# Patient Record
Sex: Male | Born: 1965 | Race: White | Hispanic: No | Marital: Married | Smoking: Former smoker
Health system: Southern US, Community
[De-identification: ages and names within clinical notes are randomized; demographics above are authoritative.]

## PROBLEM LIST (undated history)

## (undated) DIAGNOSIS — G8929 Other chronic pain: Secondary | ICD-10-CM

## (undated) DIAGNOSIS — K5730 Diverticulosis of large intestine without perforation or abscess without bleeding: Secondary | ICD-10-CM

## (undated) DIAGNOSIS — F419 Anxiety disorder, unspecified: Secondary | ICD-10-CM

## (undated) DIAGNOSIS — G4733 Obstructive sleep apnea (adult) (pediatric): Secondary | ICD-10-CM

## (undated) DIAGNOSIS — Z9989 Dependence on other enabling machines and devices: Secondary | ICD-10-CM

## (undated) DIAGNOSIS — I10 Essential (primary) hypertension: Secondary | ICD-10-CM

## (undated) DIAGNOSIS — F329 Major depressive disorder, single episode, unspecified: Secondary | ICD-10-CM

## (undated) DIAGNOSIS — M5136 Other intervertebral disc degeneration, lumbar region: Secondary | ICD-10-CM

## (undated) DIAGNOSIS — M1990 Unspecified osteoarthritis, unspecified site: Secondary | ICD-10-CM

## (undated) DIAGNOSIS — M545 Low back pain, unspecified: Secondary | ICD-10-CM

## (undated) DIAGNOSIS — Z8719 Personal history of other diseases of the digestive system: Secondary | ICD-10-CM

## (undated) DIAGNOSIS — I4510 Unspecified right bundle-branch block: Secondary | ICD-10-CM

## (undated) DIAGNOSIS — M51369 Other intervertebral disc degeneration, lumbar region without mention of lumbar back pain or lower extremity pain: Secondary | ICD-10-CM

## (undated) DIAGNOSIS — N200 Calculus of kidney: Secondary | ICD-10-CM

## (undated) DIAGNOSIS — Z972 Presence of dental prosthetic device (complete) (partial): Secondary | ICD-10-CM

## (undated) DIAGNOSIS — R3915 Urgency of urination: Secondary | ICD-10-CM

## (undated) DIAGNOSIS — F32A Depression, unspecified: Secondary | ICD-10-CM

## (undated) HISTORY — DX: Anxiety disorder, unspecified: F41.9

## (undated) HISTORY — DX: Major depressive disorder, single episode, unspecified: F32.9

## (undated) HISTORY — DX: Other chronic pain: G89.29

## (undated) HISTORY — DX: Depression, unspecified: F32.A

## (undated) HISTORY — PX: NO PAST SURGERIES: SHX2092

## (undated) HISTORY — PX: COLONOSCOPY W/ BIOPSIES AND POLYPECTOMY: SHX1376

## (undated) HISTORY — PX: KIDNEY STONE SURGERY: SHX686

---

## 2000-06-09 DIAGNOSIS — G8929 Other chronic pain: Secondary | ICD-10-CM

## 2000-06-09 HISTORY — DX: Other chronic pain: G89.29

## 2007-07-24 ENCOUNTER — Other Ambulatory Visit: Payer: Self-pay

## 2007-07-24 ENCOUNTER — Emergency Department: Payer: Self-pay | Admitting: Emergency Medicine

## 2008-09-19 ENCOUNTER — Ambulatory Visit: Payer: Self-pay | Admitting: Family Medicine

## 2010-09-19 ENCOUNTER — Ambulatory Visit: Payer: Self-pay

## 2013-09-08 DIAGNOSIS — F32A Depression, unspecified: Secondary | ICD-10-CM | POA: Insufficient documentation

## 2013-09-08 DIAGNOSIS — F329 Major depressive disorder, single episode, unspecified: Secondary | ICD-10-CM | POA: Insufficient documentation

## 2013-09-08 DIAGNOSIS — M549 Dorsalgia, unspecified: Secondary | ICD-10-CM | POA: Insufficient documentation

## 2013-09-08 DIAGNOSIS — I10 Essential (primary) hypertension: Secondary | ICD-10-CM | POA: Insufficient documentation

## 2013-10-03 DIAGNOSIS — M25562 Pain in left knee: Secondary | ICD-10-CM | POA: Insufficient documentation

## 2014-09-26 DIAGNOSIS — R5383 Other fatigue: Secondary | ICD-10-CM | POA: Insufficient documentation

## 2014-12-20 DIAGNOSIS — G4733 Obstructive sleep apnea (adult) (pediatric): Secondary | ICD-10-CM | POA: Insufficient documentation

## 2015-02-05 ENCOUNTER — Emergency Department: Payer: Self-pay

## 2015-02-05 ENCOUNTER — Emergency Department
Admission: EM | Admit: 2015-02-05 | Discharge: 2015-02-05 | Disposition: A | Discharge status: Home / Self Care | Payer: Self-pay | Attending: Emergency Medicine | Admitting: Emergency Medicine

## 2015-02-05 ENCOUNTER — Telehealth: Payer: Self-pay | Admitting: Emergency Medicine

## 2015-02-05 ENCOUNTER — Encounter: Payer: Self-pay | Admitting: Emergency Medicine

## 2015-02-05 DIAGNOSIS — R1031 Right lower quadrant pain: Secondary | ICD-10-CM

## 2015-02-05 DIAGNOSIS — I10 Essential (primary) hypertension: Secondary | ICD-10-CM | POA: Insufficient documentation

## 2015-02-05 DIAGNOSIS — R079 Chest pain, unspecified: Secondary | ICD-10-CM | POA: Insufficient documentation

## 2015-02-05 DIAGNOSIS — R0602 Shortness of breath: Secondary | ICD-10-CM | POA: Insufficient documentation

## 2015-02-05 DIAGNOSIS — N200 Calculus of kidney: Secondary | ICD-10-CM | POA: Insufficient documentation

## 2015-02-05 DIAGNOSIS — Z720 Tobacco use: Secondary | ICD-10-CM | POA: Insufficient documentation

## 2015-02-05 HISTORY — DX: Other intervertebral disc degeneration, lumbar region: M51.36

## 2015-02-05 HISTORY — DX: Other intervertebral disc degeneration, lumbar region without mention of lumbar back pain or lower extremity pain: M51.369

## 2015-02-05 HISTORY — DX: Essential (primary) hypertension: I10

## 2015-02-05 LAB — COMPREHENSIVE METABOLIC PANEL
ALBUMIN: 4.1 g/dL (ref 3.5–5.0)
ALT: 38 U/L (ref 17–63)
AST: 28 U/L (ref 15–41)
Alkaline Phosphatase: 70 U/L (ref 38–126)
Anion gap: 9 (ref 5–15)
BUN: 26 mg/dL — ABNORMAL HIGH (ref 6–20)
CO2: 23 mmol/L (ref 22–32)
CREATININE: 1.39 mg/dL — AB (ref 0.61–1.24)
Calcium: 9.1 mg/dL (ref 8.9–10.3)
Chloride: 104 mmol/L (ref 101–111)
GFR calc non Af Amer: 58 mL/min — ABNORMAL LOW (ref >60)
GLUCOSE: 134 mg/dL — AB (ref 65–99)
Potassium: 4.1 mmol/L (ref 3.5–5.1)
SODIUM: 136 mmol/L (ref 135–145)
Total Bilirubin: 0.9 mg/dL (ref 0.3–1.2)
Total Protein: 7.5 g/dL (ref 6.5–8.1)

## 2015-02-05 LAB — CBC
HCT: 47.3 % (ref 40.0–52.0)
Hemoglobin: 16 g/dL (ref 13.0–18.0)
MCH: 30.7 pg (ref 26.0–34.0)
MCHC: 33.8 g/dL (ref 32.0–36.0)
MCV: 90.9 fL (ref 80.0–100.0)
PLATELETS: 214 10*3/uL (ref 150–440)
RBC: 5.2 MIL/uL (ref 4.40–5.90)
RDW: 12.5 % (ref 11.5–14.5)
WBC: 18.1 10*3/uL — ABNORMAL HIGH (ref 3.8–10.6)

## 2015-02-05 LAB — URINALYSIS COMPLETE WITH MICROSCOPIC (ARMC ONLY)
BILIRUBIN URINE: NEGATIVE
Bacteria, UA: NONE SEEN
Glucose, UA: 50 mg/dL — AB
LEUKOCYTES UA: NEGATIVE
NITRITE: NEGATIVE
PH: 7 (ref 5.0–8.0)
Protein, ur: NEGATIVE mg/dL
SQUAMOUS EPITHELIAL / LPF: NONE SEEN
Specific Gravity, Urine: 1.041 — ABNORMAL HIGH (ref 1.005–1.030)

## 2015-02-05 LAB — LIPASE, BLOOD: LIPASE: 18 U/L — AB (ref 22–51)

## 2015-02-05 LAB — TROPONIN I: Troponin I: <0.03 ng/mL (ref <0.031)

## 2015-02-05 MED ORDER — HYDROMORPHONE HCL 2 MG PO TABS: 2.0000 mg | ORAL_TABLET | Freq: Two times a day (BID) | ORAL | Status: DC | PRN

## 2015-02-05 MED ORDER — IOHEXOL 300 MG/ML  SOLN
100.0000 mL | Freq: Once | INTRAMUSCULAR | Status: AC | PRN
  Administered 2015-02-05: 100 mL via INTRAVENOUS

## 2015-02-05 MED ORDER — TAMSULOSIN HCL 0.4 MG PO CAPS: 0.4000 mg | ORAL_CAPSULE | Freq: Every day | ORAL | Status: DC

## 2015-02-05 MED ORDER — MORPHINE SULFATE (PF) 4 MG/ML IV SOLN
4.0000 mg | Freq: Once | INTRAVENOUS | Status: AC
  Administered 2015-02-05: 4 mg via INTRAVENOUS
  Filled 2015-02-05: qty 1

## 2015-02-05 MED ORDER — ONDANSETRON HCL 4 MG/2ML IJ SOLN
4.0000 mg | Freq: Once | INTRAMUSCULAR | Status: AC
  Administered 2015-02-05: 4 mg via INTRAVENOUS
  Filled 2015-02-05: qty 2

## 2015-02-05 MED ORDER — SODIUM CHLORIDE 0.9 % IV BOLUS (SEPSIS)
1000.0000 mL | Freq: Once | INTRAVENOUS | Status: AC
  Administered 2015-02-05: 1000 mL via INTRAVENOUS

## 2015-02-05 MED ORDER — ONDANSETRON HCL 4 MG PO TABS: 4.0000 mg | ORAL_TABLET | Freq: Every day | ORAL | Status: DC | PRN

## 2015-02-05 MED ORDER — IOHEXOL 240 MG/ML SOLN
25.0000 mL | Freq: Once | INTRAMUSCULAR | Status: AC | PRN
  Administered 2015-02-05: 25 mL via ORAL

## 2015-02-05 NOTE — ED Provider Notes (Signed)
[facility] Emergency Department Provider Note  ____________________________________________  Time seen: Approximately 350 AM  I have reviewed the triage vital signs and the nursing notes.   HISTORY  Chief Complaint Abdominal Pain and Chest Pain    HPI ARTH NICASTRO is a 49 y.o. male who comes into the hospital with abdominal pain. The patient reports that last night her having some severe right lower quadrant abdominal pain. He reports that he initially thought it was possibly food poisoning but after the pain did not improve after a couple of hours he decided to come into the hospital for evaluation. The patient reports that he's also developed some chest pain and sweats in the middle of his lower chest. The patient reports that this pain is a 9 out of 10 in intensity. He does not take anything for the pain has not had any fevers. He tried to make himself vomit but was unable to do so. The patient had some chills and some dull chest pain that he rates a 5 out of 10. The patient has some shortness of breath and also some back pain. The patient is unsure what causing his symptoms and he decided to come into the hospital for further evaluation.   Past Medical History  Diagnosis Date  . Degenerative disc disease, lumbar   . Hypertension     There are no active problems to display for this patient.   History reviewed. No pertinent past surgical history.  No current outpatient prescriptions on file.  Allergies Review of patient's allergies indicates no known allergies.  History reviewed. No pertinent family history.  Social History Social History  Substance Use Topics  . Smoking status: Current Every Day Smoker -- 1.00 packs/day  . Smokeless tobacco: None  . Alcohol Use: No    Review of Systems Constitutional: No fever/chills Eyes: No visual changes. ENT: No sore throat. Cardiovascular: chest pain. Respiratory:  shortness of  breath. Gastrointestinal: abdominal pain.  No nausea, no vomiting.  No diarrhea.  No constipation. Genitourinary: Negative for dysuria. Musculoskeletal:  back pain. Skin: Negative for rash. Neurological: Negative for headaches, focal weakness or numbness.  10-point ROS otherwise negative.  ____________________________________________   PHYSICAL EXAM:  VITAL SIGNS: ED Triage Vitals  Enc Vitals Group     BP 02/05/15 0400 162/103 mmHg     Pulse Rate 02/05/15 0400 45     Resp 02/05/15 0402 21     Temp 02/05/15 0402 98.2 F (36.8 C)     Temp Source 02/05/15 0402 Oral     SpO2 02/05/15 0400 97 %     Weight 02/05/15 0402 220 lb (99.791 kg)     Height 02/05/15 0402 5\' 8"  (1.727 m)     Head Cir --      Peak Flow --      Pain Score 02/05/15 0403 10     Pain Loc --      Pain Edu? --      Excl. in GC? --     Constitutional: Alert and oriented. ill appearing and in moderate distress. Eyes: Conjunctivae are normal. PERRL. EOMI. Head: Atraumatic. Nose: No congestion/rhinnorhea. Mouth/Throat: Mucous membranes are moist.  Oropharynx non-erythematous. Cardiovascular: Normal rate, regular rhythm. Grossly normal heart sounds.  Good peripheral circulation. Respiratory: Normal respiratory effort.  No retractions. Lungs CTAB. Gastrointestinal: Soft with RLQ pain to palpation. No distention. Positive bowel sounds Musculoskeletal: No lower extremity tenderness nor edema.   Neurologic:  Normal speech and language.  Skin:  Skin is warm, dry and intact.  Psychiatric: Mood and affect are normal.   ____________________________________________   LABS (all labs ordered are listed, but only abnormal results are displayed)  Labs Reviewed  CBC - Abnormal; Notable for the following:    WBC 18.1 (*)    All other components within normal limits  COMPREHENSIVE METABOLIC PANEL - Abnormal; Notable for the following:    Glucose, Bld 134 (*)    BUN 26 (*)    Creatinine, Ser 1.39 (*)    GFR calc non  Af Amer 58 (*)    All other components within normal limits  LIPASE, BLOOD - Abnormal; Notable for the following:    Lipase 18 (*)    All other components within normal limits  TROPONIN I  URINALYSIS COMPLETEWITH MICROSCOPIC ([facility] ONLY)   ____________________________________________  EKG  ED ECG REPORT I, Rebecka Apley, the attending physician, personally viewed and interpreted this ECG.   Date: 02/05/2015  EKG Time: 400  Rate: 74  Rhythm: normal sinus rhythm, with sinus arrhythmia  Axis: normal  Intervals:none  ST&T Change: none  ____________________________________________  RADIOLOGY  CXR: No active cardiopulmonary disease  CTabd and pelvis: Obstructing 5 x 5 x 7 mm right ureteral calculus 2-3 cm above the ureterovesical junction. Marked hydroureter and hydronephrosis. ____________________________________________   PROCEDURES  Procedure(s) performed: None  Critical Care performed: No  ____________________________________________   INITIAL IMPRESSION / ASSESSMENT AND PLAN / ED COURSE  Pertinent labs & imaging results that were available during my care of the patient were reviewed by me and considered in my medical decision making (see chart for details).  The patient is a 49 year old male who comes in tonight with some abdominal pain and chest pain. The patient has some distinct right lower quadrant tenderness to palpation on exam. The patient also has a white blood cell count of 18. I'll give the patient some normal saline as well as morphine and Zofran. We will perform a CT scan to evaluate for possible appendicitis. The patient will be reassessed once he received his CT scan.  The patient's care will be signed out to Dr. Mayford Knife who will follow-up the results of the urinalysis to determine if the patient has an infected kidney stone. Otherwise the patient will be discharged home with some pain medication, Flomax and follow-up with  urology. ____________________________________________   FINAL CLINICAL IMPRESSION(S) / ED DIAGNOSES  Final diagnoses:  Right lower quadrant abdominal pain      Rebecka Apley, MD 02/05/15 [phone]

## 2015-02-05 NOTE — ED Provider Notes (Signed)
Patient's urine looks unremarkable. Be discharged with pain medication, antiemetics and Flomax. Encouraged to have close follow-up with his urologist  Emily Filbert, MD 02/05/15 [id number]

## 2015-02-05 NOTE — ED Notes (Addendum)
Pt to ED from home via EMS c/o abd pain and chest pain.  Pt states pain to abd RLQ started last night around midnight that is sharp and constant, en route pt started having lower mid chest pain that is like pressure.  Pt had 2 episodes of self induced vomiting.  Pt denies SOB, dizziness, denies pain radiating to extremities.  Pt reports diaphoresis at home.  Per EMS EKG sinus arrhythmia.  Pt is A&Ox4, speaking in complete and coherent sentences and in NAD at this time.

## 2015-02-05 NOTE — ED Notes (Signed)
Pharmacy called asking for cheaper antiemetic.  Said zofran would cost about $60.  Per dr Mayford Knife can change to promethazine 25 mg tablets--take 1-2 tablets every 4-6 hours as needed.  Quantity of 20 tablets.

## 2015-02-05 NOTE — Discharge Instructions (Signed)

## 2015-02-07 ENCOUNTER — Telehealth: Payer: Self-pay | Admitting: Emergency Medicine

## 2015-02-07 NOTE — ED Notes (Signed)
Pt called asking for referral to [facility] instead of [facility] because he has [facility] and pcp.  He was unsure of name of pcp, but says he was told to get referral sent to urology.  i called [facility] and faxed snapshot to sandra who does referrals.   Patient had also mentioned he would be out of pain pills soon, and i asked him to follow up with his pcp for that.  He said he only filled 1/2 the flomax rx because he couldn't afford, and i explained medication management and gave him number.  He agrees to call them now to see if they have flomax.

## 2015-02-09 ENCOUNTER — Other Ambulatory Visit: Payer: Self-pay | Admitting: Urology

## 2015-02-13 ENCOUNTER — Encounter (HOSPITAL_BASED_OUTPATIENT_CLINIC_OR_DEPARTMENT_OTHER): Payer: Self-pay | Admitting: *Deleted

## 2015-02-13 ENCOUNTER — Other Ambulatory Visit: Payer: Self-pay | Admitting: Urology

## 2015-02-13 NOTE — Progress Notes (Signed)
NPO AFTER MN.  ARRIVE AT 0930.  CURRENT LAB RESULTS AND EKG. WILL TAKE TOPROL, PERCOCET, AND FLOMAX AM DOS W/ SIPS OF WATER.

## 2015-02-14 ENCOUNTER — Ambulatory Visit (HOSPITAL_BASED_OUTPATIENT_CLINIC_OR_DEPARTMENT_OTHER): Payer: Self-pay | Admitting: Anesthesiology

## 2015-02-14 ENCOUNTER — Encounter (HOSPITAL_BASED_OUTPATIENT_CLINIC_OR_DEPARTMENT_OTHER)
Admission: RE | Disposition: A | Discharge status: Home / Self Care | Payer: Self-pay | Source: Ambulatory Visit | Attending: Urology

## 2015-02-14 ENCOUNTER — Ambulatory Visit (HOSPITAL_BASED_OUTPATIENT_CLINIC_OR_DEPARTMENT_OTHER)
Admission: RE | Admit: 2015-02-14 | Discharge: 2015-02-14 | Disposition: A | Discharge status: Home / Self Care | Payer: Self-pay | Source: Ambulatory Visit | Attending: Urology | Admitting: Urology

## 2015-02-14 ENCOUNTER — Encounter (HOSPITAL_BASED_OUTPATIENT_CLINIC_OR_DEPARTMENT_OTHER): Payer: Self-pay | Admitting: *Deleted

## 2015-02-14 DIAGNOSIS — Z79891 Long term (current) use of opiate analgesic: Secondary | ICD-10-CM | POA: Insufficient documentation

## 2015-02-14 DIAGNOSIS — F17210 Nicotine dependence, cigarettes, uncomplicated: Secondary | ICD-10-CM | POA: Insufficient documentation

## 2015-02-14 DIAGNOSIS — I4510 Unspecified right bundle-branch block: Secondary | ICD-10-CM | POA: Insufficient documentation

## 2015-02-14 DIAGNOSIS — G8929 Other chronic pain: Secondary | ICD-10-CM | POA: Insufficient documentation

## 2015-02-14 DIAGNOSIS — M5136 Other intervertebral disc degeneration, lumbar region: Secondary | ICD-10-CM | POA: Insufficient documentation

## 2015-02-14 DIAGNOSIS — N201 Calculus of ureter: Secondary | ICD-10-CM

## 2015-02-14 DIAGNOSIS — M545 Low back pain: Secondary | ICD-10-CM | POA: Insufficient documentation

## 2015-02-14 DIAGNOSIS — I10 Essential (primary) hypertension: Secondary | ICD-10-CM | POA: Insufficient documentation

## 2015-02-14 DIAGNOSIS — K449 Diaphragmatic hernia without obstruction or gangrene: Secondary | ICD-10-CM | POA: Insufficient documentation

## 2015-02-14 DIAGNOSIS — G4733 Obstructive sleep apnea (adult) (pediatric): Secondary | ICD-10-CM | POA: Insufficient documentation

## 2015-02-14 DIAGNOSIS — G4730 Sleep apnea, unspecified: Secondary | ICD-10-CM | POA: Insufficient documentation

## 2015-02-14 DIAGNOSIS — N132 Hydronephrosis with renal and ureteral calculous obstruction: Secondary | ICD-10-CM | POA: Insufficient documentation

## 2015-02-14 DIAGNOSIS — M1990 Unspecified osteoarthritis, unspecified site: Secondary | ICD-10-CM | POA: Insufficient documentation

## 2015-02-14 HISTORY — DX: Low back pain: M54.5

## 2015-02-14 HISTORY — DX: Unspecified right bundle-branch block: I45.10

## 2015-02-14 HISTORY — PX: STONE EXTRACTION WITH BASKET: SHX5318

## 2015-02-14 HISTORY — DX: Calculus of kidney: N20.0

## 2015-02-14 HISTORY — DX: Diverticulosis of large intestine without perforation or abscess without bleeding: K57.30

## 2015-02-14 HISTORY — DX: Personal history of other diseases of the digestive system: Z87.19

## 2015-02-14 HISTORY — DX: Other chronic pain: G89.29

## 2015-02-14 HISTORY — PX: CYSTOSCOPY WITH RETROGRADE PYELOGRAM, URETEROSCOPY AND STENT PLACEMENT: SHX5789

## 2015-02-14 HISTORY — DX: Obstructive sleep apnea (adult) (pediatric): G47.33

## 2015-02-14 HISTORY — DX: Urgency of urination: R39.15

## 2015-02-14 HISTORY — DX: Unspecified osteoarthritis, unspecified site: M19.90

## 2015-02-14 HISTORY — DX: Presence of dental prosthetic device (complete) (partial): Z97.2

## 2015-02-14 HISTORY — DX: Low back pain, unspecified: M54.50

## 2015-02-14 HISTORY — DX: Dependence on other enabling machines and devices: Z99.89

## 2015-02-14 HISTORY — PX: CYSTOSCOPY WITH HOLMIUM LASER LITHOTRIPSY: SHX6639

## 2015-02-14 SURGERY — CYSTOURETEROSCOPY, WITH RETROGRADE PYELOGRAM AND STENT INSERTION
Anesthesia: General | Site: Bladder | Laterality: Right

## 2015-02-14 MED ORDER — IOHEXOL 350 MG/ML SOLN
INTRAVENOUS | Status: DC | PRN
  Administered 2015-02-14: 15 mL

## 2015-02-14 MED ORDER — HYDROMORPHONE HCL 2 MG PO TABS: 2.0000 mg | ORAL_TABLET | Freq: Four times a day (QID) | ORAL | Status: AC | PRN

## 2015-02-14 MED ORDER — LACTATED RINGERS IV SOLN
INTRAVENOUS | Status: DC
  Administered 2015-02-14: 10:00:00 via INTRAVENOUS
  Filled 2015-02-14: qty 1000

## 2015-02-14 MED ORDER — SODIUM CHLORIDE 0.9 % IR SOLN
Status: DC | PRN
  Administered 2015-02-14: 3000 mL via INTRAVESICAL
  Administered 2015-02-14: 1000 mL via INTRAVESICAL

## 2015-02-14 MED ORDER — MIDAZOLAM HCL 2 MG/2ML IJ SOLN
INTRAMUSCULAR | Status: AC
  Filled 2015-02-14: qty 2

## 2015-02-14 MED ORDER — FENTANYL CITRATE (PF) 100 MCG/2ML IJ SOLN
INTRAMUSCULAR | Status: AC
  Filled 2015-02-14: qty 4

## 2015-02-14 MED ORDER — KETOROLAC TROMETHAMINE 30 MG/ML IJ SOLN
30.0000 mg | Freq: Once | INTRAMUSCULAR | Status: DC
  Filled 2015-02-14: qty 1

## 2015-02-14 MED ORDER — CIPROFLOXACIN IN D5W 400 MG/200ML IV SOLN
400.0000 mg | INTRAVENOUS | Status: AC
  Administered 2015-02-14: 400 mg via INTRAVENOUS
  Filled 2015-02-14: qty 200

## 2015-02-14 MED ORDER — PROPOFOL 10 MG/ML IV BOLUS
INTRAVENOUS | Status: DC | PRN
  Administered 2015-02-14: 300 mg via INTRAVENOUS

## 2015-02-14 MED ORDER — ONDANSETRON HCL 4 MG/2ML IJ SOLN
INTRAMUSCULAR | Status: DC | PRN
  Administered 2015-02-14: 4 mg via INTRAVENOUS

## 2015-02-14 MED ORDER — ACETAMINOPHEN 10 MG/ML IV SOLN
INTRAVENOUS | Status: DC | PRN
  Administered 2015-02-14: 1000 mg via INTRAVENOUS

## 2015-02-14 MED ORDER — HYDROMORPHONE HCL 2 MG PO TABS
2.0000 mg | ORAL_TABLET | Freq: Four times a day (QID) | ORAL | Status: DC | PRN
  Administered 2015-02-14: 2 mg via ORAL
  Filled 2015-02-14: qty 1

## 2015-02-14 MED ORDER — FENTANYL CITRATE (PF) 100 MCG/2ML IJ SOLN
25.0000 ug | INTRAMUSCULAR | Status: DC | PRN
  Filled 2015-02-14: qty 1

## 2015-02-14 MED ORDER — DEXAMETHASONE SODIUM PHOSPHATE 4 MG/ML IJ SOLN
INTRAMUSCULAR | Status: DC | PRN
  Administered 2015-02-14: 10 mg via INTRAVENOUS

## 2015-02-14 MED ORDER — KETOROLAC TROMETHAMINE 30 MG/ML IJ SOLN
INTRAMUSCULAR | Status: DC | PRN
  Administered 2015-02-14: 30 mg via INTRAVENOUS

## 2015-02-14 MED ORDER — MIDAZOLAM HCL 5 MG/5ML IJ SOLN
INTRAMUSCULAR | Status: DC | PRN
  Administered 2015-02-14: 2 mg via INTRAVENOUS

## 2015-02-14 MED ORDER — LIDOCAINE HCL (CARDIAC) 20 MG/ML IV SOLN
INTRAVENOUS | Status: DC | PRN
  Administered 2015-02-14: 70 mg via INTRAVENOUS

## 2015-02-14 MED ORDER — HYDROMORPHONE HCL 2 MG PO TABS
ORAL_TABLET | ORAL | Status: AC
  Filled 2015-02-14: qty 1

## 2015-02-14 MED ORDER — FENTANYL CITRATE (PF) 100 MCG/2ML IJ SOLN
INTRAMUSCULAR | Status: DC | PRN
  Administered 2015-02-14 (×2): 25 ug via INTRAVENOUS
  Administered 2015-02-14: 50 ug via INTRAVENOUS

## 2015-02-14 MED ORDER — CIPROFLOXACIN IN D5W 400 MG/200ML IV SOLN
INTRAVENOUS | Status: AC
  Filled 2015-02-14: qty 200

## 2015-02-14 MED ORDER — PROMETHAZINE HCL 25 MG/ML IJ SOLN
6.2500 mg | INTRAMUSCULAR | Status: DC | PRN
  Filled 2015-02-14: qty 1

## 2015-02-14 SURGICAL SUPPLY — 18 items
BAG DRAIN URO-CYSTO SKYTR STRL (DRAIN) ×3 IMPLANT
BASKET LASER NITINOL 1.9FR (BASKET) ×3 IMPLANT
CATH INTERMIT  6FR 70CM (CATHETERS) ×3 IMPLANT
CLOTH BEACON ORANGE TIMEOUT ST (SAFETY) ×3 IMPLANT
FIBER LASER TRAC TIP (UROLOGICAL SUPPLIES) ×3 IMPLANT
GLOVE BIO SURGEON STRL SZ7.5 (GLOVE) ×6 IMPLANT
GOWN STRL REUS W/ TWL LRG LVL3 (GOWN DISPOSABLE) ×1 IMPLANT
GOWN STRL REUS W/ TWL XL LVL3 (GOWN DISPOSABLE) ×2 IMPLANT
GOWN STRL REUS W/TWL LRG LVL3 (GOWN DISPOSABLE) ×2
GOWN STRL REUS W/TWL XL LVL3 (GOWN DISPOSABLE) ×4
GUIDEWIRE ANG ZIPWIRE 038X150 (WIRE) ×3 IMPLANT
GUIDEWIRE STR DUAL SENSOR (WIRE) ×3 IMPLANT
IV NS IRRIG 3000ML ARTHROMATIC (IV SOLUTION) ×3 IMPLANT
MANIFOLD NEPTUNE II (INSTRUMENTS) ×3 IMPLANT
PACK CYSTO (CUSTOM PROCEDURE TRAY) ×3 IMPLANT
STENT POLARIS 5FRX26 (STENTS) ×3 IMPLANT
SYRINGE 10CC LL (SYRINGE) ×3 IMPLANT
TUBE FEEDING 8FR 16IN STR KANG (MISCELLANEOUS) ×3 IMPLANT

## 2015-02-14 NOTE — Anesthesia Procedure Notes (Addendum)
Procedure Name: LMA Insertion Date/Time: 02/14/2015 11:26 AM Performed by: Norva Pavlov Pre-anesthesia Checklist: Patient identified, Emergency Drugs available, Suction available and Patient being monitored Patient Re-evaluated:Patient Re-evaluated prior to inductionOxygen Delivery Method: Circle System Utilized Preoxygenation: Pre-oxygenation with 100% oxygen Intubation Type: IV induction Ventilation: Mask ventilation without difficulty LMA: LMA inserted LMA Size: 5.0 Number of attempts: 1 Placement Confirmation: positive ETCO2 Dental Injury: Teeth and Oropharynx as per pre-operative assessment  Comments: LMA inserted by Gwendolyn Grant, CRNA

## 2015-02-14 NOTE — Anesthesia Preprocedure Evaluation (Signed)
Anesthesia Evaluation  Patient identified by MRN, date of birth, ID band Patient awake    Reviewed: Allergy & Precautions, NPO status , Patient's Chart, lab work & pertinent test results  Airway Mallampati: II  TM Distance: <3 FB Neck ROM: Full    Dental no notable dental hx.    Pulmonary sleep apnea and Continuous Positive Airway Pressure Ventilation , Current Smoker,    Pulmonary exam normal breath sounds clear to auscultation       Cardiovascular hypertension, Pt. on medications Normal cardiovascular exam Rhythm:Regular Rate:Normal     Neuro/Psych negative neurological ROS  negative psych ROS   GI/Hepatic Neg liver ROS, hiatal hernia,   Endo/Other  negative endocrine ROS  Renal/GU negative Renal ROS  negative genitourinary   Musculoskeletal negative musculoskeletal ROS (+)   Abdominal   Peds negative pediatric ROS (+)  Hematology negative hematology ROS (+)   Anesthesia Other Findings   Reproductive/Obstetrics negative OB ROS                             Anesthesia Physical Anesthesia Plan  ASA: III  Anesthesia Plan: General   Post-op Pain Management:    Induction: Intravenous  Airway Management Planned: Oral ETT and LMA  Additional Equipment:   Intra-op Plan:   Post-operative Plan: Extubation in OR  Informed Consent: I have reviewed the patients History and Physical, chart, labs and discussed the procedure including the risks, benefits and alternatives for the proposed anesthesia with the patient or authorized representative who has indicated his/her understanding and acceptance.   Dental advisory given  Plan Discussed with: CRNA and Surgeon  Anesthesia Plan Comments:         Anesthesia Quick Evaluation

## 2015-02-14 NOTE — Discharge Instructions (Signed)
1. You may see some blood in the urine and may have some burning with urination for 48-72 hours. You also may notice that you have to urinate more frequently or urgently after your procedure which is normal.  2. You should call should you develop an inability urinate, fever > 101, persistent nausea and vomiting that prevents you from eating or drinking to stay hydrated.  3. If you have a stent, you will likely urinate more frequently and urgently until the stent is removed and you may experience some discomfort/pain in the lower abdomen and flank especially when urinating. You may take pain medication prescribed to you if needed for pain. You may also intermittently have blood in the urine until the stent is removed. 4. If you have a catheter, you will be taught how to take care of the catheter by the nursing staff prior to discharge from the hospital.  You may periodically feel a strong urge to void with the catheter in place.  This is a bladder spasm and most often can occur when having a bowel movement or moving around. It is typically self-limited and usually will stop after a few minutes.  You may use some Vaseline or Neosporin around the tip of the catheter to reduce friction at the tip of the penis. You may also see some blood in the urine.  A very small amount of blood can make the urine look quite red.  As long as the catheter is draining well, there usually is not a problem.  However, if the catheter is not draining well and is bloody, you should call the office [phone]) to notify us.  Remove stent by the String on Friday morning  [facility] [phone] Post Ureteroscopy With or Without Stent Instructions  Definitions:  Ureter: The duct that transports urine from the kidney to the bladder. Stent:   A plastic hollow tube that is placed into the ureter, from the kidney to the                 bladder to prevent the ureter from swelling shut.  GENERAL  INSTRUCTIONS:  Despite the fact that no skin incisions were used, the area around the ureter and bladder is raw and irritated. The stent is a foreign body which will further irritate the bladder wall. This irritation is manifested by increased frequency of urination, both day and night, and by an increase in the urge to urinate. In some, the urge to urinate is present almost always. Sometimes the urge is strong enough that you may not be able to stop yourself from urinating. The only real cure is to remove the stent and then give time for the bladder wall to heal which can't be done until the danger of the ureter swelling shut has passed, which varies.  You may see some blood in your urine while the stent is in place and a few days afterwards. Do not be alarmed, even if the urine was clear for a while. Get off your feet and drink lots of fluids until clearing occurs. If you start to pass clots or don't improve, call us.  DIET: You may return to your normal diet immediately. Because of the raw surface of your bladder, alcohol, spicy foods, acid type foods and drinks with caffeine may cause irritation or frequency and should be used in moderation. To keep your urine flowing freely and to avoid constipation, drink plenty of fluids during the day ( 8-10 glasses ). Tip: Avoid  cranberry juice because it is very acidic.  ACTIVITY: Your physical activity doesn't need to be restricted. However, if you are very active, you may see some blood in your urine. We suggest that you reduce your activity under these circumstances until the bleeding has stopped.  BOWELS: It is important to keep your bowels regular during the postoperative period. Straining with bowel movements can cause bleeding. A bowel movement every other day is reasonable. Use a mild laxative if needed, such as Milk of Magnesia 2-3 tablespoons, or 2 Dulcolax tablets. Call if you continue to have problems. If you have been taking narcotics for pain,  before, during or after your surgery, you may be constipated. Take a laxative if necessary.   MEDICATION: You should resume your pre-surgery medications unless told not to. In addition you will often be given an antibiotic to prevent infection. These should be taken as prescribed until the bottles are finished unless you are having an unusual reaction to one of the drugs.  PROBLEMS YOU SHOULD REPORT TO [place]:  Fevers over 100.5 Fahrenheit.  Heavy bleeding, or clots ( See above notes about blood in urine ).  Inability to urinate.  Drug reactions ( hives, rash, nausea, vomiting, diarrhea ).  Severe burning or pain with urination that is not improving.  FOLLOW-UP: You will need a follow-up appointment to monitor your progress. Call for this appointment at the number listed above. Usually the first appointment will be about three to fourteen days after your surgery.      Post Anesthesia Home Care Instructions  Activity: Get plenty of rest for the remainder of the day. A responsible adult should stay with you for 24 hours following the procedure.  For the next 24 hours, DO NOT: -Drive a car -Advertising copywriter -Drink alcoholic beverages -Take any medication unless instructed by your physician -Make any legal decisions or sign important papers.  Meals: Start with liquid foods such as gelatin or soup. Progress to regular foods as tolerated. Avoid greasy, spicy, heavy foods. If nausea and/or vomiting occur, drink only clear liquids until the nausea and/or vomiting subsides. Call your physician if vomiting continues.  Special Instructions/Symptoms: Your throat may feel dry or sore from the anesthesia or the breathing tube placed in your throat during surgery. If this causes discomfort, gargle with warm salt water. The discomfort should disappear within 24 hours.  If you had a scopolamine patch placed behind your ear for the management of post- operative nausea and/or vomiting:  1. The  medication in the patch is effective for 72 hours, after which it should be removed.  Wrap patch in a tissue and discard in the trash. Wash hands thoroughly with soap and water. 2. You may remove the patch earlier than 72 hours if you experience unpleasant side effects which may include dry mouth, dizziness or visual disturbances. 3. Avoid touching the patch. Wash your hands with soap and water after contact with the patch.

## 2015-02-14 NOTE — H&P (Signed)
FRIEDRICH HARRIOTT is an 49 y.o. male.    Chief Complaint: Pre-op Right Ureteroscopic Stone Manipulation  HPI:   1 - Right Ureteral Stone - 7mm right distal ureteral stone with mod hydro by ER CT 01/2015. No additional stones. Stone is 7mm, SSD 11cm, 750HU.  PMH sig for DJD / chronic pain on chronic narcotics through pain clinic.  Today "Shafer" is seen to proceed with right ureteroscopic stone manipulation. No interval fevers or stone passage. Most recent UA without infectious parameters.   Past Medical History  Diagnosis Date  . Hypertension   . Right nephrolithiasis   . Incomplete right bundle branch block   . Diverticulosis of colon   . History of hiatal hernia   . Chronic low back pain   . Urgency of urination   . Degenerative disc disease, lumbar   . Arthritis   . OSA on CPAP   . Wears partial dentures     UPPER AND LOWER    Past Surgical History  Procedure Laterality Date  . No past surgeries      History reviewed. No pertinent family history. Social History:  reports that he has been smoking Cigarettes.  He has a 30 pack-year smoking history. He has never used smokeless tobacco. He reports that he drinks about 1.2 oz of alcohol per week. He reports that he does not use illicit drugs.  Allergies: No Known Allergies  No prescriptions prior to admission    No results found for this or any previous visit (from the past 48 hour(s)). No results found.  Review of Systems  Constitutional: Negative.   HENT: Negative.   Eyes: Negative.   Respiratory: Negative.   Cardiovascular: Negative.   Gastrointestinal: Negative.   Genitourinary: Positive for flank pain.  Musculoskeletal: Negative.   Skin: Negative.   Neurological: Negative.   Endo/Heme/Allergies: Negative.   Psychiatric/Behavioral: Negative.     There were no vitals taken for this visit. Physical Exam  Constitutional: He appears well-developed.  HENT:  Head: Normocephalic.  Eyes: Pupils are equal, round, and  reactive to light.  Neck: Normal range of motion.  Cardiovascular: Normal rate.   Respiratory: Effort normal.  GI: Soft.  Genitourinary:  Mild Rt CVAT  Musculoskeletal: Normal range of motion.  Neurological: He is alert.  Skin: Skin is warm.  Psychiatric: He has a normal mood and affect. His behavior is normal. Judgment and thought content normal.     Assessment/Plan  1 - Right Ureteral Stone - We discussed ureteroscopic stone manipulation with basketing and laser-lithotripsy in detail.  We discussed risks including bleeding, infection, damage to kidney / ureter  bladder, rarely loss of kidney. We discussed anesthetic risks and rare but serious surgical complications including DVT, PE, MI, and mortality. We specifically addressed that in 5-10% of cases a staged approach is required with stenting followed by re-attempt ureteroscopy if anatomy unfavorable.   The patient voiced understanding and wishes to proceed.   Khang Hannum 02/14/2015, 8:05 AM

## 2015-02-14 NOTE — Transfer of Care (Signed)
Last Vitals:  Filed Vitals:   02/14/15 0954  BP: 138/87  Pulse: 68  Temp: 36.4 C  Resp: 20    Immediate Anesthesia Transfer of Care Note  Patient: Jason [name]  Procedure(s) Performed: Procedure(s) (LRB): CYSTOSCOPY WITH RETROGRADE PYELOGRAM, URETEROSCOPY AND STENT PLACEMENT (Right) CYSTOSCOPY WITH HOLMIUM LASER LITHOTRIPSY (Right) STONE EXTRACTION WITH BASKET (Right)  Patient Location: PACU  Anesthesia Type: General  Level of Consciousness: awake, alert  and oriented  Airway & Oxygen Therapy: Patient Spontanous Breathing and Patient connected to face mask oxygen  Post-op Assessment: Report given to PACU RN and Post -op Vital signs reviewed and stable  Post vital signs: Reviewed and stable  Complications: No apparent anesthesia complications

## 2015-02-14 NOTE — Brief Op Note (Signed)
02/14/2015  12:12 PM  PATIENT:  Roselind Messier  49 y.o. male  PRE-OPERATIVE DIAGNOSIS:  NEPHROLITHIASIS  POST-OPERATIVE DIAGNOSIS:  NEPHROLITHIASIS  PROCEDURE:  Procedure(s): CYSTOSCOPY WITH RETROGRADE PYELOGRAM, URETEROSCOPY AND STENT PLACEMENT (Right) CYSTOSCOPY WITH HOLMIUM LASER LITHOTRIPSY (Right) STONE EXTRACTION WITH BASKET (Right)  SURGEON:  Surgeon(s) and Role:    * Sebastian Ache, MD - Primary  PHYSICIAN ASSISTANT:   ASSISTANTS: Greggory Brandy MD   ANESTHESIA:   general  EBL:  Total I/O In: 400 [I.V.:400] Out: -   BLOOD ADMINISTERED:none CC PRBC  DRAINS: none   LOCAL MEDICATIONS USED:  NONE  SPECIMEN:  Source of Specimen:  Rt ureteral stone fragments  DISPOSITION OF SPECIMEN:  [facility] for compositional analysis  COUNTS:  YES  TOURNIQUET:  * No tourniquets in log *  DICTATION: .Other Dictation: Dictation Number [id number]  PLAN OF CARE: Discharge to home after PACU  PATIENT DISPOSITION:  PACU - hemodynamically stable.   Delay start of Pharmacological VTE agent (>24hrs) due to surgical blood loss or risk of bleeding: not applicable

## 2015-02-15 ENCOUNTER — Encounter (HOSPITAL_BASED_OUTPATIENT_CLINIC_OR_DEPARTMENT_OTHER): Payer: Self-pay | Admitting: Urology

## 2015-02-15 NOTE — Op Note (Deleted)
Jason [name]                  ACCOUNT NO.:  [id number]  MEDICAL RECORD NO.:  [id number]  LOCATION:  ED16A                        FACILITY:  [facility]  PHYSICIAN:  Jason Ache, MD     DATE OF BIRTH:  1966-04-09  DATE OF PROCEDURE: 02/14/2015                               OPERATIVE REPORT   DIAGNOSIS:  Right ureteral stone.  PROCEDURES: 1. Cystoscopy with right retrograde pyelogram interpretation. 2. Right ureteroscopy with laser lithotripsy. 3. Insertion of right ureteral stent, 5 x 26 Polaris with tether.  ESTIMATED BLOOD LOSS:  Nil.  COMPLICATIONS:  None.  SPECIMENS:  Right ureteral stone fragments for compositional analysis.  FINDINGS: 1. Unremarkable urinary bladder. 2. Partially impacted right distal ureteral stone, approximately 6 to     7 mm with mild hydroureteronephrosis above this. 3. Complete resolution of all stone fragments, larger than 1/3rd mm     following laser lithotripsy and basket extraction. 4. Successful placement of right ureteral stent, proximal in renal     pelvis and distal in urinary bladder with tether to the dorsum of     the penis.  ASSISTANT:  Jason [name], M.D.  INDICATIONS:  Jason [name] is a 49 year old gentleman with recent episode of right flank pain.  On workup for which, he was found to have a right distal ureteral stone.  He was given a trial of medical therapy for several weeks, but difficult to pass the stone.  Options were discussed for definitive management including continued medical therapy versus shockwave lithotripsy versus ureteroscopy and he wishes to proceed with the latter.  Informed consent was obtained and placed in the medical record.  PROCEDURE IN DETAIL:  The patient being Jason [name] verified.  Procedure being right ureteroscopic stone manipulation was confirmed.  Procedure was carried out.  Time-out was performed.  Intravenous antibiotics were administered.  General LMA anesthesia was introduced.  The patient  was placed into a low lithotomy position.  Sterile field was created by prepping and draping the patient's penis, perineum, proximal thighs using iodine x3.  Next, cystourethroscopy was performed using a 23- French rigid cystoscope with 30-degree offset lens.  After calibrating the urethra to 26-French, inspection of the anterior and posterior urethra unremarkable.  Inspection of the urinary bladder revealed no diverticula, calcifications, papular lesions.  Ureteral orifices were singleton.  The right ureteral orifice was cannulated with 6-French end- hole catheter and right retrograde pyelogram was obtained.  The right retrograde pyelogram demonstrated single right ureter with single system right kidney.  There was an ovoid filling defect in the distal ureter, consistent with known stone.  There was mild hydroureteronephrosis above this.  A 0.038 zip wire was advanced to the level of the upper pole, set aside as a safety wire.  An 8-French feeding tube was placed in urinary bladder for pressure release.  Next, semi-rigid ureteroscopy was performed in the distal ureter alongside a separate sensor working wire.  As expected, the calcification in question was encountered in the distal ureter.  It appeared to be much too large for simple basketing.  As such, holmium laser energy applied to the stone using settings of 0.2 joules and 20 Hz, fragmenting the  stone in approximately 4 smaller pieces.  These were then sequentially grasped in escape basket to the proximal pole of the bladder and set aside.  Semi-rigid ureteroscopy was performed of the distal 2/3rd of the right ureter.  No mucosal abnormalities.  Additional calcifications were seen.  There was some mild mucosal edema around the area of prior stone impaction.  The semi-rigid ureteroscope was then exchanged for the flexible ureteroscope over the Sensor working wire to the level of the kidney.  Systematic inspection of all calices  revealed no additional stone fragments.  No mucosal abnormalities.  The ureteroscope was removed under continuous vision and again only some mild mucosal edema was noted at the site of prior stone impaction.  It was felt that interval stenting would be warranted.  Using once again the rigid cystoscope, the stone fragments were irrigated and set aside for permanent pathology.  Bladder was emptied and a new 5 x 26 Polaris stent was placed with remaining safety wire using fluoroscopic guidance.  Good proximal and distal deployment were noted.  Tether was left in place and fashioned to the dorsum of the penis.  Procedure was terminated.  The patient tolerated the procedure well.  There were no immediate periprocedural complications.  The patient was taken to the postanesthesia care unit in stable condition.          ______________________________ Jason Ache, MD     TM/MEDQ  D:  02/14/2015  T:  02/15/2015  Job:  [id number]

## 2015-02-15 NOTE — Anesthesia Postprocedure Evaluation (Signed)
  Anesthesia Post-op Note  Patient: Jason [name]  Procedure(s) Performed: Procedure(s) (LRB): CYSTOSCOPY WITH RETROGRADE PYELOGRAM, URETEROSCOPY AND STENT PLACEMENT (Right) CYSTOSCOPY WITH HOLMIUM LASER LITHOTRIPSY (Right) STONE EXTRACTION WITH BASKET (Right)  Patient Location: PACU  Anesthesia Type: General  Level of Consciousness: awake and alert   Airway and Oxygen Therapy: Patient Spontanous Breathing  Post-op Pain: mild  Post-op Assessment: Post-op Vital signs reviewed, Patient's Cardiovascular Status Stable, Respiratory Function Stable, Patent Airway and No signs of Nausea or vomiting  Last Vitals:  Filed Vitals:   02/14/15 1425  BP: 114/71  Pulse: 72  Temp: 36.6 C  Resp: 18    Post-op Vital Signs: stable   Complications: No apparent anesthesia complications

## 2015-02-15 NOTE — Op Note (Signed)
NAMEJOSHUA, [name]                  ACCOUNT NO.:  [id number]  MEDICAL RECORD NO.:  [id number]  LOCATION:                               FACILITY:  [facility]  PHYSICIAN:  Sebastian Ache, MD     DATE OF BIRTH:  11/23/1965  DATE OF PROCEDURE:  02/14/2015                              OPERATIVE REPORT  DIAGNOSIS:  Right ureteral stone.  PROCEDURES: 1. Cystoscopy with right retrograde pyelogram interpretation. 2. Right ureteroscopy with laser lithotripsy. 3. Insertion of right ureteral stent, 5 x 26 Polaris with tether.  ESTIMATED BLOOD LOSS:  Nil.  COMPLICATIONS:  None.  SPECIMENS:  Right ureteral stone fragments for compositional analysis.  FINDINGS: 1. Unremarkable urinary bladder. 2. Partially impacted right distal ureteral stone, approximately 6 to     7 mm with mild hydroureteronephrosis above this. 3. Complete resolution of all stone fragments, larger than 1/3rd mm     following laser lithotripsy and basket extraction. 4. Successful placement of right ureteral stent, proximal in renal     pelvis and distal in urinary bladder with tether to the dorsum of     the penis.  ASSISTANT:  Greggory Brandy, M.D.  INDICATIONS:  Jason [name] is a 49 year old gentleman with recent episode of right flank pain.  On workup for which, he was found to have a right distal ureteral stone.  He was given a trial of medical therapy for several weeks, but difficult to pass the stone.  Options were discussed for definitive management including continued medical therapy versus shockwave lithotripsy versus ureteroscopy and he wishes to proceed with the latter.  Informed consent was obtained and placed in the medical record.  PROCEDURE IN DETAIL:  The patient being Jason [name] verified.  Procedure being right ureteroscopic stone manipulation was confirmed.  Procedure was carried out.  Time-out was performed.  Intravenous antibiotics were administered.  General LMA anesthesia was introduced.  The patient  was placed into a low lithotomy position.  Sterile field was created by prepping and draping the patient's penis, perineum, proximal thighs using iodine x3.  Next, cystourethroscopy was performed using a 23- French rigid cystoscope with 30-degree offset lens.  After calibrating the urethra to 26-French, inspection of the anterior and posterior urethra unremarkable.  Inspection of the urinary bladder revealed no diverticula, calcifications, papular lesions.  Ureteral orifices were singleton.  The right ureteral orifice was cannulated with 6-French end- hole catheter and right retrograde pyelogram was obtained.  The right retrograde pyelogram demonstrated single right ureter with single system right kidney.  There was an ovoid filling defect in the distal ureter, consistent with known stone.  There was mild hydroureteronephrosis above this.  A 0.038 zip wire was advanced to the level of the upper pole, set aside as a safety wire.  An 8-French feeding tube was placed in urinary bladder for pressure release.  Next, semi-rigid ureteroscopy was performed in the distal ureter alongside a separate sensor working wire.  As expected, the calcification in question was encountered in the distal ureter.  It appeared to be much too large for simple basketing.  As such, holmium laser energy applied to the stone using settings of 0.2 joules and  20 Hz, fragmenting the stone in approximately 4 smaller pieces.  These were then sequentially grasped in escape basket to the proximal pole of the bladder and set aside.  Semi-rigid ureteroscopy was performed of the distal 2/3rd of the right ureter.  No mucosal abnormalities.  Additional calcifications were seen.  There was some mild mucosal edema around the area of prior stone impaction.  The semi-rigid ureteroscope was then exchanged for the flexible ureteroscope over the Sensor working wire to the level of the kidney.  Systematic inspection of all calices  revealed no additional stone fragments.  No mucosal abnormalities.  The ureteroscope was removed under continuous vision and again only some mild mucosal edema was noted at the site of prior stone impaction.  It was felt that interval stenting would be warranted.  Using once again the rigid cystoscope, the stone fragments were irrigated and set aside for permanent pathology.  Bladder was emptied and a new 5 x 26 Polaris stent was placed with remaining safety wire using fluoroscopic guidance.  Good proximal and distal deployment were noted.  Tether was left in place and fashioned to the dorsum of the penis.  Procedure was terminated.  The patient tolerated the procedure well.  There were no immediate periprocedural complications.  The patient was taken to the postanesthesia care unit in stable condition.          ______________________________ Sebastian Ache, MD     TM/MEDQ  D:  02/14/2015  T:  02/15/2015  Job:  [id number]

## 2015-02-20 ENCOUNTER — Emergency Department
Admission: EM | Admit: 2015-02-20 | Discharge: 2015-02-20 | Disposition: A | Discharge status: Home / Self Care | Payer: Self-pay | Attending: Emergency Medicine | Admitting: Emergency Medicine

## 2015-02-20 ENCOUNTER — Emergency Department: Payer: Self-pay

## 2015-02-20 DIAGNOSIS — I10 Essential (primary) hypertension: Secondary | ICD-10-CM | POA: Insufficient documentation

## 2015-02-20 DIAGNOSIS — R634 Abnormal weight loss: Secondary | ICD-10-CM | POA: Insufficient documentation

## 2015-02-20 DIAGNOSIS — K2980 Duodenitis without bleeding: Secondary | ICD-10-CM | POA: Insufficient documentation

## 2015-02-20 DIAGNOSIS — G8929 Other chronic pain: Secondary | ICD-10-CM | POA: Insufficient documentation

## 2015-02-20 DIAGNOSIS — K921 Melena: Secondary | ICD-10-CM | POA: Insufficient documentation

## 2015-02-20 DIAGNOSIS — Z720 Tobacco use: Secondary | ICD-10-CM | POA: Insufficient documentation

## 2015-02-20 DIAGNOSIS — Z79899 Other long term (current) drug therapy: Secondary | ICD-10-CM | POA: Insufficient documentation

## 2015-02-20 LAB — COMPREHENSIVE METABOLIC PANEL
ALBUMIN: 4.2 g/dL (ref 3.5–5.0)
ALT: 64 U/L — AB (ref 17–63)
AST: 44 U/L — AB (ref 15–41)
Alkaline Phosphatase: 87 U/L (ref 38–126)
Anion gap: 10 (ref 5–15)
BILIRUBIN TOTAL: 1.6 mg/dL — AB (ref 0.3–1.2)
BUN: 20 mg/dL (ref 6–20)
CHLORIDE: 94 mmol/L — AB (ref 101–111)
CO2: 28 mmol/L (ref 22–32)
CREATININE: 1.12 mg/dL (ref 0.61–1.24)
Calcium: 9.4 mg/dL (ref 8.9–10.3)
GFR calc Af Amer: >60 mL/min (ref >60)
GLUCOSE: 149 mg/dL — AB (ref 65–99)
POTASSIUM: 3.8 mmol/L (ref 3.5–5.1)
Sodium: 132 mmol/L — ABNORMAL LOW (ref 135–145)
Total Protein: 8.5 g/dL — ABNORMAL HIGH (ref 6.5–8.1)

## 2015-02-20 LAB — URINALYSIS COMPLETE WITH MICROSCOPIC (ARMC ONLY)
BACTERIA UA: NONE SEEN
BILIRUBIN URINE: NEGATIVE
GLUCOSE, UA: NEGATIVE mg/dL
Nitrite: NEGATIVE
Protein, ur: 30 mg/dL — AB
Specific Gravity, Urine: 1.019 (ref 1.005–1.030)
pH: 5 (ref 5.0–8.0)

## 2015-02-20 LAB — CBC
HEMATOCRIT: 52.3 % — AB (ref 40.0–52.0)
Hemoglobin: 18 g/dL (ref 13.0–18.0)
MCH: 31.1 pg (ref 26.0–34.0)
MCHC: 34.5 g/dL (ref 32.0–36.0)
MCV: 90.2 fL (ref 80.0–100.0)
PLATELETS: 330 10*3/uL (ref 150–440)
RBC: 5.8 MIL/uL (ref 4.40–5.90)
RDW: 12.7 % (ref 11.5–14.5)
WBC: 12.6 10*3/uL — AB (ref 3.8–10.6)

## 2015-02-20 LAB — LIPASE, BLOOD: LIPASE: 15 U/L — AB (ref 22–51)

## 2015-02-20 MED ORDER — IOHEXOL 240 MG/ML SOLN: 25.0000 mL | Freq: Once | INTRAMUSCULAR | Status: DC | PRN

## 2015-02-20 MED ORDER — IOHEXOL 240 MG/ML SOLN
25.0000 mL | Freq: Once | INTRAMUSCULAR | Status: DC | PRN
  Administered 2015-02-20: 25 mL via ORAL
  Filled 2015-02-20: qty 50

## 2015-02-20 MED ORDER — PANTOPRAZOLE SODIUM 40 MG PO TBEC
40.0000 mg | DELAYED_RELEASE_TABLET | Freq: Once | ORAL | Status: AC
  Administered 2015-02-20: 40 mg via ORAL
  Filled 2015-02-20: qty 1

## 2015-02-20 MED ORDER — SUCRALFATE 1 G PO TABS: 1.0000 g | ORAL_TABLET | Freq: Four times a day (QID) | ORAL | Status: DC

## 2015-02-20 MED ORDER — IOHEXOL 240 MG/ML SOLN: 25.0000 mL | INTRAMUSCULAR | Status: AC

## 2015-02-20 MED ORDER — OMEPRAZOLE 40 MG PO CPDR: 40.0000 mg | DELAYED_RELEASE_CAPSULE | Freq: Every day | ORAL | Status: DC

## 2015-02-20 MED ORDER — IOHEXOL 300 MG/ML  SOLN
100.0000 mL | Freq: Once | INTRAMUSCULAR | Status: AC | PRN
  Administered 2015-02-20: 75 mL via INTRAVENOUS

## 2015-02-20 MED ORDER — SUCRALFATE 1 G PO TABS
1.0000 g | ORAL_TABLET | Freq: Once | ORAL | Status: AC
  Administered 2015-02-20: 1 g via ORAL
  Filled 2015-02-20: qty 1

## 2015-02-20 NOTE — ED Notes (Signed)
CT notified that pt finished with contrast, spoke with Caryn Bee.

## 2015-02-20 NOTE — Discharge Instructions (Signed)
Please seek medical attention for any high fevers, chest pain, shortness of breath, change in behavior, persistent vomiting, bloody stool or any other new or concerning symptoms. ° ° °Duodenitis °Duodenitis is inflammation of the lining of the first part of your small intestine (duodenum). There are two types of duodenitis: °· Acute duodenitis (develops suddenly and is short lived). °·  Chronic duodenitis (develops over an extended period and lasts months to years). °CAUSES  °Duodenitis is most often caused by infection with the bacterium Helicobacter pylori (H. pylori). H. pylori increases the production of stomach acid and causes changes in the environment of the duodenum. This irritates and damages the cells of the duodenum causing inflammation. °Other causes of duodenitis include:  °· Long-term use of nonsteroidal anti-inflammatory drugs (NSAIDs). NSAIDs change the lining of the duodenum and make it more prone to injury from stomach acid. °· Excessive use of alcohol. Alcohol increases stomach acid and changes the lining of the duodenum which makes it more likely for inflammation to develop. °· Giardiasis. Giardiasis is a common infection of the small intestine. It can cause inflammation of the duodenum.   °· Other gastrointestinal disorders, such as Crohn disease. People with these disorders are more likely to develop duodenitis. °SYMPTOMS  °Although duodenitis does not always cause symptoms, symptoms that do occur include: °· Nausea or vomiting. °· Gassy, bloated feeling or an uncomfortable feeling of fullness after eating. °· Burning, cramps, or pain in the upper abdominal area. °DIAGNOSIS  °To diagnose duodenitis, your health care provider may use results from:  °· An exam of the duodenum using a thin tube with a tiny camera on the tip, which is placed down your throat (endoscope). The endoscope is passed through your stomach and into your duodenum. Sometimes a sample of tissue from your duodenum is removed  with the endoscope. The sample is then examined under a microscope (biopsy) for signs of inflammation and H. pylori infection.   °· Tests that check samples of your blood or stool for H. pylori infection.   °· A test that checks the gases in a sample of your expired breath for H. pylori infection. The test measures the levels of carbon dioxide in your breath after you drink a special solution. °· An X-ray exam using a special liquid that you swallow to illuminate your digestive tract (barium) to show signs of inflammation. °TREATMENT  °Treatment will depend on the cause of the duodenitis. The most common treatments include: °· Use of medication to treat infection. °· Medication to reduce stomach acid. °· Discontinuing the use of NSAIDs. °· Management of other gastrointestinal conditions. °· Avoiding alcohol consumption. °Additionally, taking the following steps can help to reduce the severity of your symptoms: °· Drink enough water to keep your urine clear or pale yellow. °· Avoid consuming these foods or drinks: °¨ Caffeinated drinks. °¨ Chocolate. °¨ Peppermint or mint-flavored food or drinks. °¨ Garlic. °¨ Onions. °¨ Spicy foods. °¨ Citrus fruits, such as oranges, lemons, or limes. °¨ Foods that use tomato-based sauces, such as pasta sauce, chili, salsa, and pizza. °¨ Fatty foods. °¨ Fried foods. °Document Released: 09/20/2012 Document Revised: 10/10/2013 Document Reviewed: 09/20/2012 °[employer]® Patient Information ©2015 [employer]. This information is not intended to replace advice given to you by your health care provider. Make sure you discuss any questions you have with your health care provider. ° °

## 2015-02-20 NOTE — ED Notes (Signed)
Pt states he had kidney stone with renal stent placement last Wednesday..states for the past 3 days is having increased RLQ pain with N/V and black tarry stools.Marland Kitchen

## 2015-02-20 NOTE — ED Provider Notes (Signed)
[facility] Emergency Department Provider Note   ____________________________________________  Time seen: 1725  I have reviewed the triage vital signs and the nursing notes.   HISTORY  Chief Complaint Abdominal Pain   History limited by: Not Limited   HPI Jason [name] is a 49 y.o. male who presents to the emergency department today with complaints ofincreasing abdominal pain, black tarry stools and nausea and vomiting. Additionally he has complaints of weight loss. He states that these symptoms started a day after a lithotripsy and right ureteral stent placement. That happened 6 days ago. He states the next day he started having the abdominal pain, nausea and vomiting. Just say stated that he has had an 18 pound weight loss past week. He states he has noticed black and tarry stools. His never had any history of GI bleed. Denies any fevers.   Past Medical History  Diagnosis Date  . Hypertension   . Right nephrolithiasis   . Incomplete right bundle branch block   . Diverticulosis of colon   . History of hiatal hernia   . Chronic low back pain   . Urgency of urination   . Degenerative disc disease, lumbar   . Arthritis   . OSA on CPAP   . Wears partial dentures     UPPER AND LOWER    There are no active problems to display for this patient.   Past Surgical History  Procedure Laterality Date  . No past surgeries    . Cystoscopy with retrograde pyelogram, ureteroscopy and stent placement Right 02/14/2015    Procedure: CYSTOSCOPY WITH RETROGRADE PYELOGRAM, URETEROSCOPY AND STENT PLACEMENT;  Surgeon: Sebastian Ache, MD;  Location: [facility];  Service: Urology;  Laterality: Right;  . Cystoscopy with holmium laser lithotripsy Right 02/14/2015    Procedure: CYSTOSCOPY WITH HOLMIUM LASER LITHOTRIPSY;  Surgeon: Sebastian Ache, MD;  Location: [facility];  Service: Urology;  Laterality: Right;  . Stone extraction with basket Right  02/14/2015    Procedure: STONE EXTRACTION WITH BASKET;  Surgeon: Sebastian Ache, MD;  Location: [facility];  Service: Urology;  Laterality: Right;    Current Outpatient Rx  Name  Route  Sig  Dispense  Refill  . HYDROmorphone (DILAUDID) 2 MG tablet   Oral   Take 1 tablet (2 mg total) by mouth every 6 (six) hours as needed for severe pain.   12 tablet   0   . lisinopril (PRINIVIL,ZESTRIL) 20 MG tablet   Oral   Take 20 mg by mouth every morning.         . metoprolol succinate (TOPROL-XL) 25 MG 24 hr tablet   Oral   Take 25 mg by mouth every morning.         . ondansetron (ZOFRAN) 4 MG tablet   Oral   Take 1 tablet (4 mg total) by mouth daily as needed for nausea or vomiting.   20 tablet   1   . oxyCODONE-acetaminophen (PERCOCET/ROXICET) 5-325 MG per tablet   Oral   Take 2 tablets by mouth every 4 (four) hours as needed for severe pain.         . tamsulosin (FLOMAX) 0.4 MG CAPS capsule   Oral   Take 1 capsule (0.4 mg total) by mouth daily after breakfast.   30 capsule   0     Allergies Review of patient's allergies indicates no known allergies.  No family history on file.  Social History Social History  Substance Use Topics  . Smoking status: Current Every Day Smoker -- 1.00 packs/day for 30 years    Types: Cigarettes  . Smokeless tobacco: Never Used  . Alcohol Use: 1.2 oz/week    2 Shots of liquor per week    Review of Systems  Constitutional: Negative for fever. Positive for weight loss Cardiovascular: Negative for chest pain. Respiratory: Negative for shortness of breath. Gastrointestinal: Positive for abdominal pain, nausea and vomiting. Positive for black and tarry stool Genitourinary: Negative for dysuria. Musculoskeletal: Negative for back pain. Skin: Negative for rash. Neurological: Negative for headaches, focal weakness or numbness.  10-point ROS otherwise negative.  ____________________________________________   PHYSICAL  EXAM:  VITAL SIGNS: ED Triage Vitals  Enc Vitals Group     BP 02/20/15 1152 105/85 mmHg     Pulse Rate 02/20/15 1152 93     Resp 02/20/15 1152 18     Temp 02/20/15 1152 97.8 F (36.6 C)     Temp Source 02/20/15 1152 Oral     SpO2 02/20/15 1152 98 %     Weight 02/20/15 1152 202 lb (91.627 kg)     Height 02/20/15 1152  (1.727 m)     Head Cir --      Peak Flow --      Pain Score 02/20/15 1153 4   Constitutional: Alert and oriented. Well appearing and in no distress. Eyes: Conjunctivae are normal. PERRL. Normal extraocular movements. ENT   Head: Normocephalic and atraumatic.   Nose: No congestion/rhinnorhea.   Mouth/Throat: Mucous membranes are moist.   Neck: No stridor. Hematological/Lymphatic/Immunilogical: No cervical lymphadenopathy. Cardiovascular: Normal rate, regular rhythm.  No murmurs, rubs, or gallops. Respiratory: Normal respiratory effort without tachypnea nor retractions. Breath sounds are clear and equal bilaterally. No wheezes/rales/rhonchi. Gastrointestinal: Soft and nontender. No distention. Brown stool on glove, GUIAC positive.  Genitourinary: Deferred Musculoskeletal: Normal range of motion in all extremities. No joint effusions.  No lower extremity tenderness nor edema. Neurologic:  Normal speech and language. No gross focal neurologic deficits are appreciated. Speech is normal.  Skin:  Skin is warm, dry and intact. No rash noted. Psychiatric: Mood and affect are normal. Speech and behavior are normal. Patient exhibits appropriate insight and judgment.  ____________________________________________    LABS (pertinent positives/negatives)  Labs Reviewed  LIPASE, BLOOD - Abnormal; Notable for the following:    Lipase 15 (*)    All other components within normal limits  COMPREHENSIVE METABOLIC PANEL - Abnormal; Notable for the following:    Sodium 132 (*)    Chloride 94 (*)    Glucose, Bld 149 (*)    Total Protein 8.5 (*)    AST 44 (*)     ALT 64 (*)    Total Bilirubin 1.6 (*)    All other components within normal limits  CBC - Abnormal; Notable for the following:    WBC 12.6 (*)    HCT 52.3 (*)    All other components within normal limits  URINALYSIS COMPLETEWITH MICROSCOPIC ([facility] ONLY) - Abnormal; Notable for the following:    Color, Urine YELLOW (*)    APPearance HAZY (*)    Ketones, ur TRACE (*)    Hgb urine dipstick 3+ (*)    Protein, ur 30 (*)    Leukocytes, UA 1+ (*)    Squamous Epithelial / LPF 0-5 (*)    All other components within normal limits     ____________________________________________   EKG  None  ____________________________________________    RADIOLOGY  CT abd/pel  IMPRESSION: 1. Mild improvement in the degree of perinephric and periureteral inflammation on the right, but with persistent moderate to severe hydronephrosis. No calculus currently seen, but there is evidence of inflammation involving the mucosa of the distal right ureter and there is edematous change at the right ureterovesical junction. 2. New from the prior study, there is mucosal thickening and mild surrounding inflammation involving the descending duodenum consistent with duodenitis. There is mild mesenteric adenopathy now adjacent to this portion of the duodenum. No evidence of perforation identified.  ____________________________________________   PROCEDURES  Procedure(s) performed: None  Critical Care performed: No  ____________________________________________   INITIAL IMPRESSION / ASSESSMENT AND PLAN / ED COURSE  Pertinent labs & imaging results that were available during my care of the patient were reviewed by me and considered in my medical decision making (see chart for details).  Patient to the emergency department today with concerns for abdominal pain nausea vomiting black tarry stools per patient was guaiac positive however no grossly bloody stool on the glove. Hemoglobin within normal  limits. CT scan was obtained which showed duodenitis. I discussed this finding with the patient will plan on discharging home with sucralfate and an acid. Initially patient had continued ureteral dilatation however he stated that he felt like his kidneys in urination had much improved since the lithotripsy. No fevers at this point. I advised patient follow-up with his urologist.  ____________________________________________   FINAL CLINICAL IMPRESSION(S) / ED DIAGNOSES  Final diagnoses:  Duodenitis     Phineas Semen, MD 02/20/15 2147

## 2015-03-06 ENCOUNTER — Ambulatory Visit: Payer: Self-pay

## 2015-09-06 ENCOUNTER — Encounter: Payer: Self-pay | Admitting: Pharmacist

## 2016-07-18 ENCOUNTER — Telehealth: Payer: Self-pay | Admitting: Pharmacist

## 2016-07-18 NOTE — Telephone Encounter (Signed)
Cialis PAP submitted to manufacturer today.

## 2017-01-07 ENCOUNTER — Telehealth: Payer: Self-pay | Admitting: Pharmacy Technician

## 2017-01-07 NOTE — Telephone Encounter (Signed)
Patient failed to provide 2018 proof of income.  No additional medication assistance will be provided by [facility] without the required proof of income documentation.  Patient notified by letter.  Shravan Salahuddin J. Zeena Starkel Care Manager [facility] 

## 2017-02-02 ENCOUNTER — Encounter: Payer: Self-pay | Admitting: Pharmacist

## 2017-02-02 ENCOUNTER — Telehealth: Payer: Self-pay | Admitting: Pharmacy Technician

## 2017-02-02 ENCOUNTER — Ambulatory Visit: Payer: Self-pay | Admitting: Pharmacist

## 2017-02-02 ENCOUNTER — Encounter (INDEPENDENT_AMBULATORY_CARE_PROVIDER_SITE_OTHER): Payer: Self-pay

## 2017-02-02 VITALS — BP 156/90 | Wt 223.0 lb

## 2017-02-02 DIAGNOSIS — Z79899 Other long term (current) drug therapy: Secondary | ICD-10-CM

## 2017-02-02 NOTE — Patient Instructions (Addendum)
Contact Dr. Seward Carol to send [facility] clinic new prescriptions

## 2017-02-02 NOTE — Telephone Encounter (Signed)
Patient eligible to receive medication assistance at [facility] through 2018, as long as eligibility requirements continue to be met.  Yanelly Cantrelle J. Toby Ayad Care Manager [facility] 

## 2017-02-02 NOTE — Progress Notes (Signed)
Medication Management Clinic Visit Note  Patient: Jason [name] MRN: [id number] Date of Birth: 1965-11-21 PCP: Sandrea Hughs, NP   Roselind Messier 51 y.o. male presents for a medication therapy management visit and medication management clinic with the pharmacist visit today.  BP (!) 156/90 (BP Location: Right Arm, Patient Position: Sitting, Cuff Size: Normal)   Wt 223 lb (101.2 kg)   BMI 33.91 kg/m    Patient Information   Past Medical History:  Diagnosis Date  . Anxiety   . Arthritis   . Chronic low back pain   . Chronic pain 2002  . Degenerative disc disease, lumbar   . Depression   . Diverticulosis of colon   . History of hiatal hernia   . Hypertension   . Incomplete right bundle branch block   . OSA on CPAP   . Right nephrolithiasis   . Urgency of urination   . Wears partial dentures    UPPER AND LOWER      Past Surgical History:  Procedure Laterality Date  . COLONOSCOPY W/ BIOPSIES AND POLYPECTOMY    . CYSTOSCOPY WITH HOLMIUM LASER LITHOTRIPSY Right 02/14/2015   Procedure: CYSTOSCOPY WITH HOLMIUM LASER LITHOTRIPSY;  Surgeon: Sebastian Ache, MD;  Location: [facility];  Service: Urology;  Laterality: Right;  . CYSTOSCOPY WITH RETROGRADE PYELOGRAM, URETEROSCOPY AND STENT PLACEMENT Right 02/14/2015   Procedure: CYSTOSCOPY WITH RETROGRADE PYELOGRAM, URETEROSCOPY AND STENT PLACEMENT;  Surgeon: Sebastian Ache, MD;  Location: [facility];  Service: Urology;  Laterality: Right;  . KIDNEY STONE SURGERY    . NO PAST SURGERIES    . STONE EXTRACTION WITH BASKET Right 02/14/2015   Procedure: STONE EXTRACTION WITH BASKET;  Surgeon: Sebastian Ache, MD;  Location: [facility];  Service: Urology;  Laterality: Right;     Family History  Problem Relation Age of Onset  . Hypertension Mother   . Cancer Mother   . Hypertension Father   . Dementia Father     New Diagnoses (since last visit):   Family Support: unknown    Lifestyle Diet: Breakfast: nothing  Lunch:pizza, lasagna, microwave dinners Dinner:pork chops, chicken, spaghetti, pizza Drinks:soft drinks, water, Gatorade     History  Alcohol Use  . 1.2 oz/week  . 2 Shots of liquor per week      History  Smoking Status  . Current Every Day Smoker  . Packs/day: 1.00  . Years: 35.00  . Types: Cigarettes  Smokeless Tobacco  . Never Used      Health Maintenance  Topic Date Due  . HIV Screening  02/25/1980  . TETANUS/TDAP  02/25/1984  . COLONOSCOPY  02/25/2015  . INFLUENZA VACCINE  01/07/2017   Prior to Admission medications   Medication Sig Start Date End Date Taking? Authorizing Provider  albuterol (PROVENTIL HFA;VENTOLIN HFA) 108 (90 Base) MCG/ACT inhaler Inhale 2 puffs into the lungs every 4 (four) hours as needed for wheezing or shortness of breath (4-6 hours PRN).   Yes [provider]  aspirin EC 81 MG tablet Take 81 mg by mouth daily.    [provider]  buPROPion (ZYBAN) 150 MG 12 hr tablet Take 150 mg by mouth 2 (two) times daily.    [provider]  cetirizine (ZYRTEC) 10 MG tablet Take 10 mg by mouth daily.    [provider]  citalopram (CELEXA) 10 MG tablet Take 10 mg by mouth daily.    [provider]  cyclobenzaprine (FLEXERIL) 10 MG tablet Take 10  mg by mouth at bedtime.    [provider]  fluticasone (FLONASE) 50 MCG/ACT nasal spray Place 1 spray into both nostrils daily.    [provider]  gabapentin (NEURONTIN) 300 MG capsule Take 300 mg by mouth 3 (three) times daily.    [provider]  hydrochlorothiazide (HYDRODIURIL) 25 MG tablet Take 25 mg by mouth daily.    [provider]  lisinopril (PRINIVIL,ZESTRIL) 20 MG tablet Take 20 mg by mouth every morning.    [provider]  metoprolol succinate (TOPROL-XL) 25 MG 24 hr tablet Take 25 mg by mouth 2 (two) times daily.     [provider]  naproxen (NAPROSYN) 500 MG  tablet Take 500 mg by mouth daily as needed.    [provider]  oxyCODONE-acetaminophen (PERCOCET/ROXICET) 5-325 MG per tablet Take 2 tablets by mouth every 4 (four) hours as needed for severe pain.    [provider]  simvastatin (ZOCOR) 20 MG tablet Take 20 mg by mouth daily.    [provider]  tadalafil (CIALIS) 5 MG tablet Take 5 mg by mouth daily as needed for erectile dysfunction.    [provider]  tamsulosin (FLOMAX) 0.4 MG CAPS capsule Take 1 capsule (0.4 mg total) by mouth daily after breakfast. Patient not taking: Reported on 02/02/2017 02/05/15   Emily Filbert, MD    Assessment and Plan: 51 year old male present to medication management clinic [facility]) for medication therapy management with pharmacist. Patient was previously denied approval to fill medications at [facility] clinic due to not turning in proof of income. Today, patient brought in and filled out everything needed to be re-approved. Patient to contact PCP to get refills from [facility] since has been out of medications for about 1-2 months.   Uncontrolled HTN Medications include HCTZ, lisinopril, metoprolol. Patient states he has been out of for 1-2 months now. Some of his medications have not been filled at [facility] in over 8 months. Is now approved to receive medication from [facility] clinic and will contact PCP to refill all medications.   Chronic Pain  Followed by Phineas Real. Takes percocet and gabapentin for back and feet pain. States pain controlled on current doses.   Depression Medications include bupropion and citalopram. Patient states feels lethargic and tired all the time, even before stopping his medications 1-2 months ago; [facility] has not filled these in over 6 months.   Asthma Uses albuterol inhaler once daily at night before bed. Patient states this help him breath and in addition uses 'breathing machine'. Will receive inhaler through PAP program at [facility].   Cleopatra Cedar  Pharmacy  Resident

## 2017-03-13 ENCOUNTER — Telehealth: Payer: Self-pay | Admitting: Pharmacist

## 2017-03-13 NOTE — Telephone Encounter (Signed)
03/13/17 Faxed [employer] application for enrollment-Ventolin HFA Inhale 2 puffs every four to six hours as needed for wheezing.Forde Radon

## 2017-03-17 ENCOUNTER — Telehealth: Payer: Self-pay | Admitting: Pharmacist

## 2017-03-17 NOTE — Telephone Encounter (Signed)
03/17/17 Faxed [employer] application for Cialis  Take one tablet by mouth daily, # 120.Forde Radon

## 2017-04-11 DIAGNOSIS — F419 Anxiety disorder, unspecified: Secondary | ICD-10-CM | POA: Insufficient documentation

## 2017-04-11 DIAGNOSIS — G4700 Insomnia, unspecified: Secondary | ICD-10-CM | POA: Insufficient documentation

## 2017-04-13 ENCOUNTER — Telehealth: Payer: Self-pay | Admitting: Pharmacist

## 2017-04-13 NOTE — Telephone Encounter (Signed)
04/13/17 Faxed script for New Med to [facility]-patient already enrolled-Flovent HFA 110mcg Inhale 2 puffs with spacer 2 times a day for Asthma.Forde RadonAJ

## 2017-05-19 ENCOUNTER — Telehealth: Payer: Self-pay | Admitting: Pharmacist

## 2017-05-19 NOTE — Telephone Encounter (Signed)
05/19/17 Placed refill online with [facility] for Ventolin HFA, to release 06/17/17, order# [id number].Forde [email or web address]

## 2017-06-10 ENCOUNTER — Telehealth: Payer: Self-pay | Admitting: Pharmacist

## 2017-06-10 NOTE — Telephone Encounter (Signed)
06/10/17 Placed refill online for Flovent HFA 110, to release 07/14/17, order# [id number].Forde [email or web address]

## 2017-06-18 IMAGING — CT CT ABD-PELV W/ CM
1 of 3 series · 13 of 32 positions shown, 18 images · IV contrast (omnipaque)
Comparison: 01/27/15

CLINICAL DATA: Kidney stone with renal stent placement last
[REDACTED], increased right lower quadrant pain with nausea and
vomiting and black tarry stools for the past 3 days

EXAM:
CT ABDOMEN AND PELVIS WITH CONTRAST
TECHNIQUE: Multidetector CT imaging of the abdomen and pelvis was performed
using the standard protocol following bolus administration of
intravenous contrast.
CONTRAST:  75mL OMNIPAQUE IOHEXOL 300 MG/ML  SOLN

[Series 2: routine abd pel with · axial · 0.83mm/px · z∈[-1061,-606]mm · 13 of 103 slices shown, 18 images]
[im 6/103  soft-tissue]
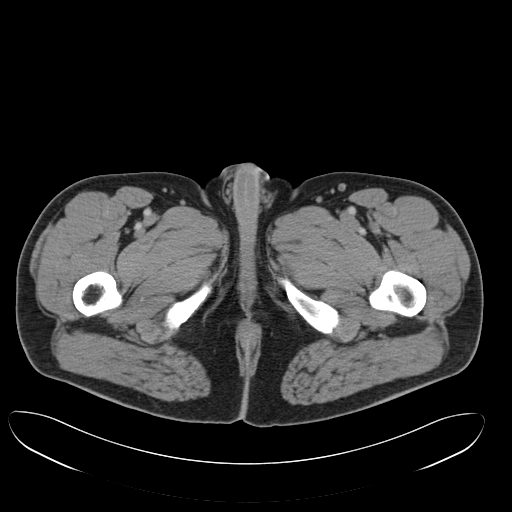
[im 6/103  bone]
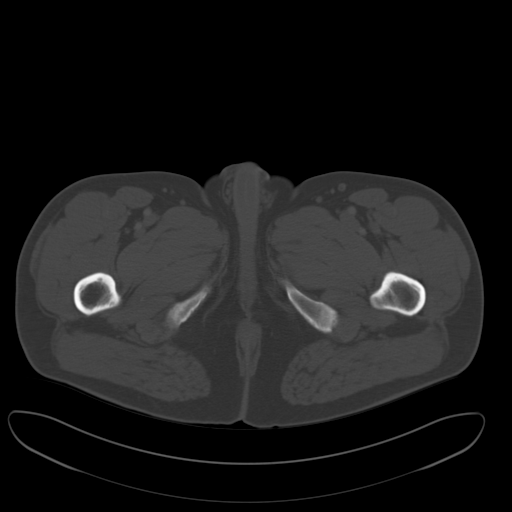
[im 18/103  soft-tissue]
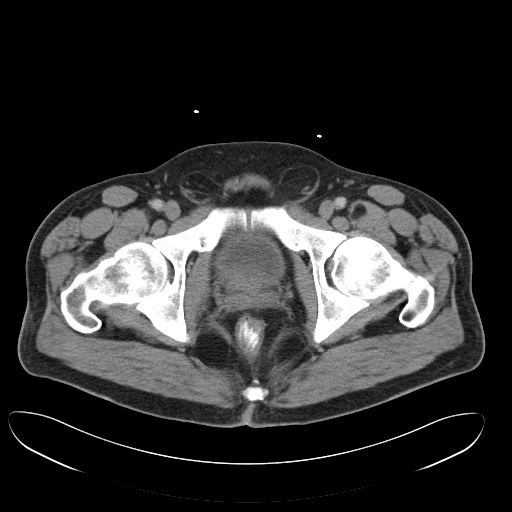
[im 23/103  soft-tissue]
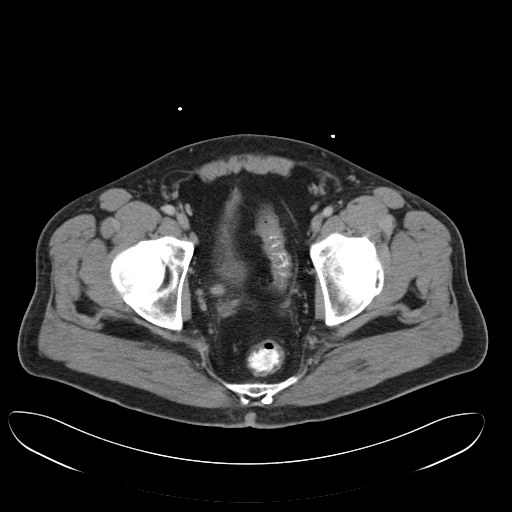
[im 29/103  soft-tissue]
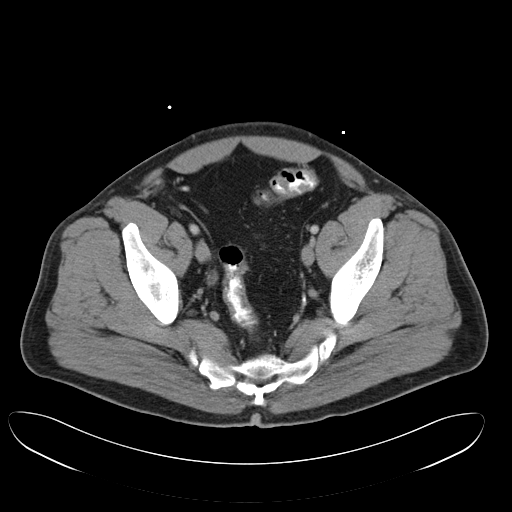
[im 40/103  soft-tissue]
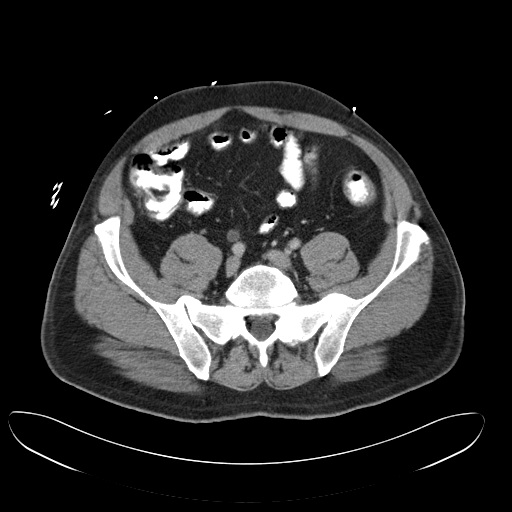
[im 46/103  soft-tissue]
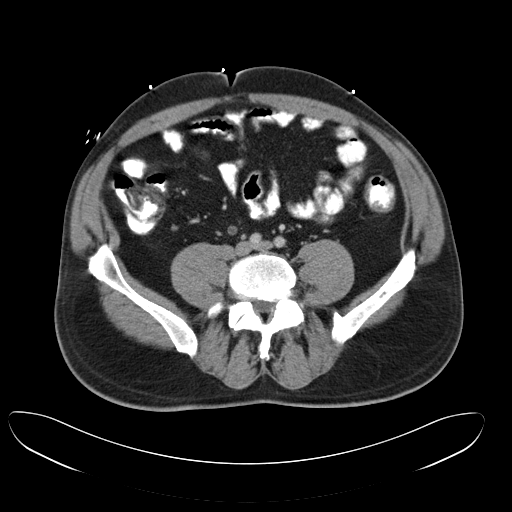
[im 57/103  soft-tissue]
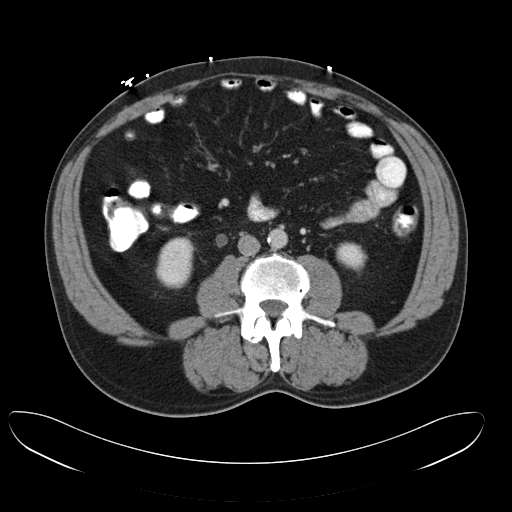
[im 63/103  soft-tissue]
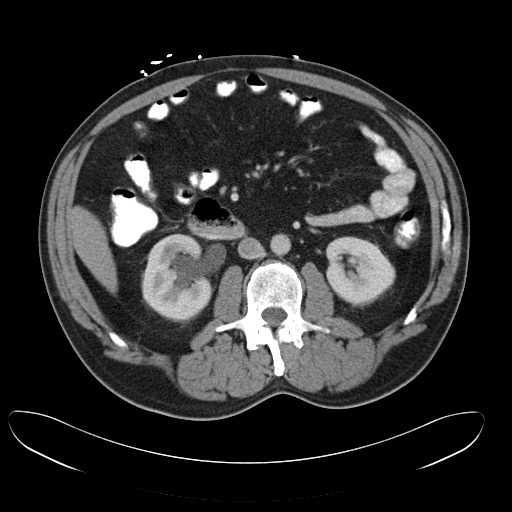
[im 74/103  soft-tissue]
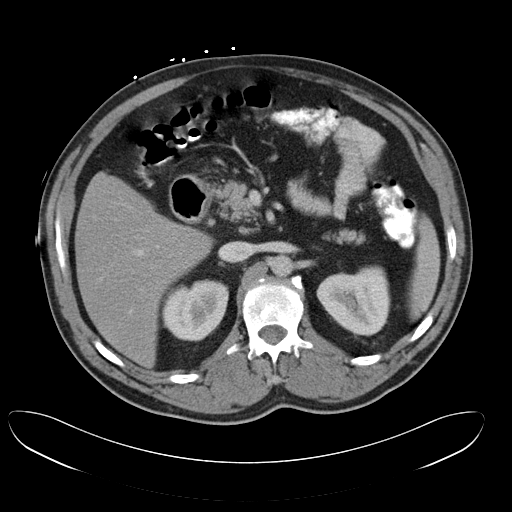
[im 74/103  bone]
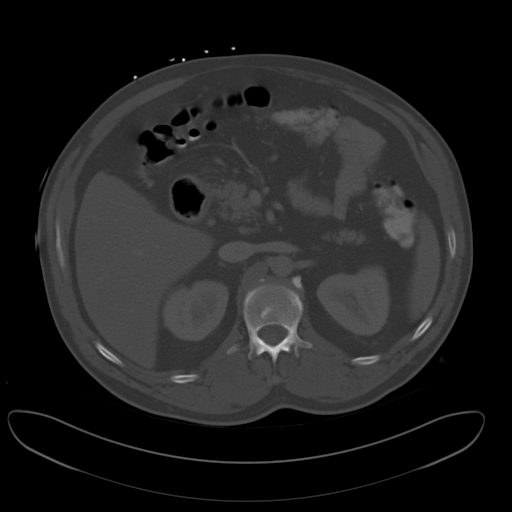
[im 80/103  soft-tissue]
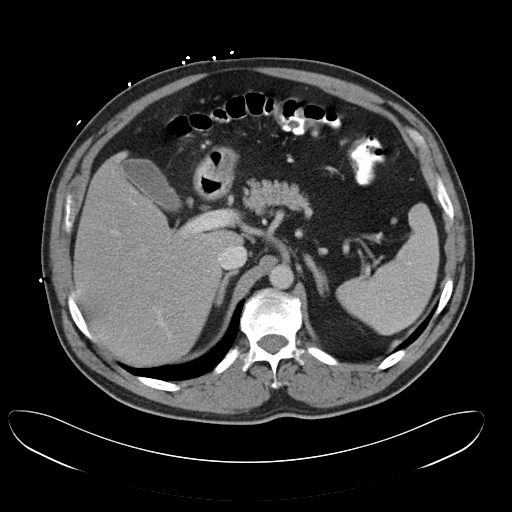
[im 80/103  lung]
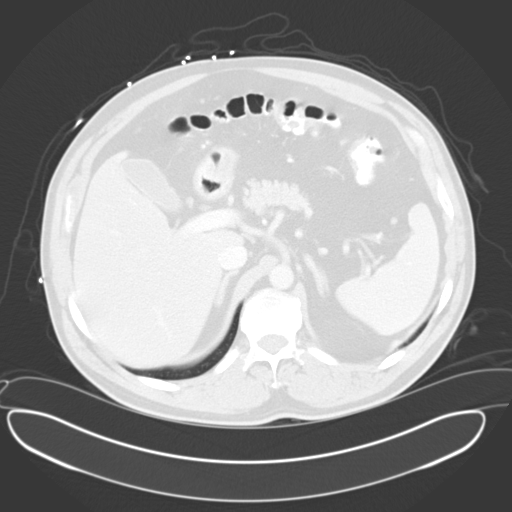
[im 86/103  soft-tissue]
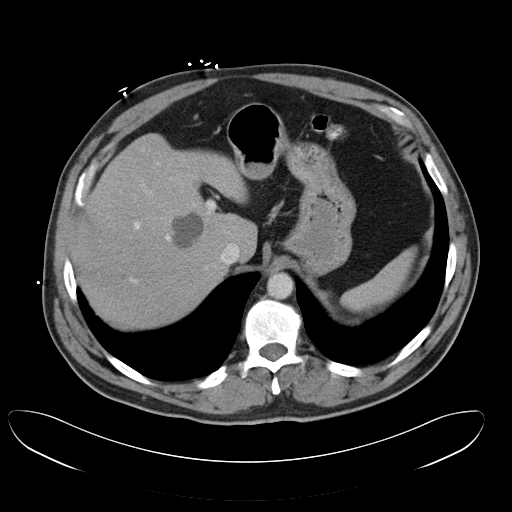
[im 86/103  lung]
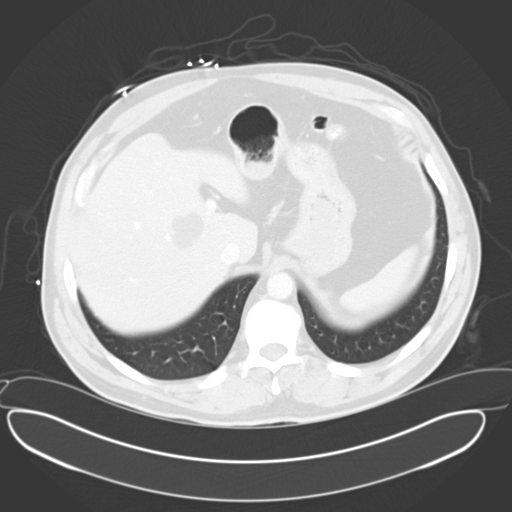
[im 91/103  lung]
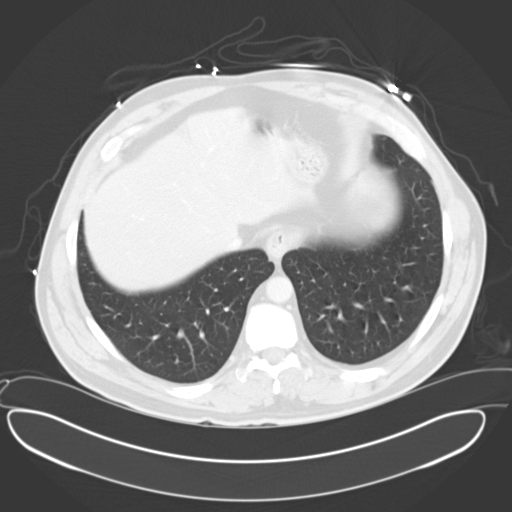
[im 97/103  soft-tissue]
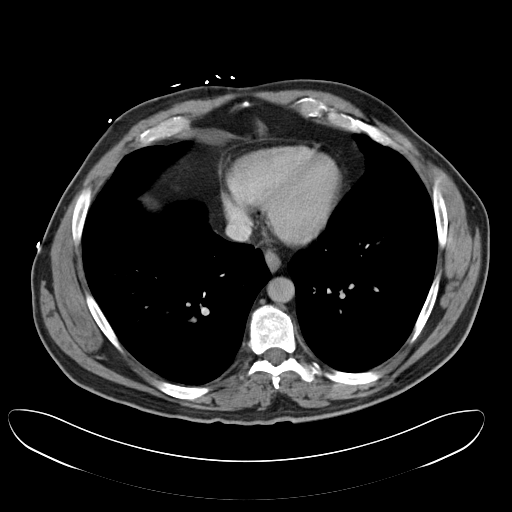
[im 97/103  lung]
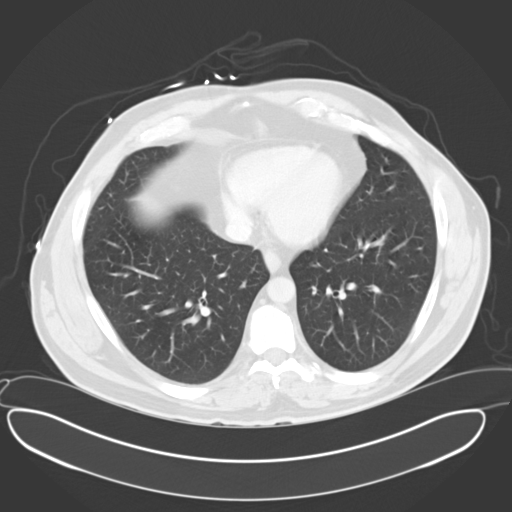

[13 of 32 positions shown; findings below may reference images not displayed]

FINDINGS: Lower chest:  negative

Hepatobiliary: 3 cm cyst right lobe of the liver stable. Of 7 mm
low-attenuation lesion posterior right lobe of the liver too small
to characterize but likely another cyst.

Pancreas: negative

Spleen: negative

Adrenals/Urinary Tract: Adrenal glands are negative. 9 mm cyst
midpole and 7 mm cyst lower pole left kidney, stable. 11 mm cyst
midpole right kidney stable. Stable dilatation of the right renal
pelvis and ureter, with mild but improved perinephric and
periureteral inflammation. Distal ureter demonstrates thickening and
enhancement of the wall. No calculus identified on the current
study. There is edematous change in the bladder wall at the right
ureterovesical junction.

Stomach/Bowel: Diverticulosis sigmoid colon. Evidence of sigmoid
wall thickening but without surrounding inflammatory change in the
sigmoid noted to be decompressed. Similarly, transverse colon and
descending colon are decompressed with simulation of wall
thickening. Descending colon shows mild wall thickening and mild
surrounding inflammatory change. There are a few surrounding small
mesenteric lymph nodes, with these findings representing a change
from recent prior study.

Vascular/Lymphatic: Mild aortoiliac calcification

Reproductive: negative

Other: negative

Musculoskeletal: No acute findings. Bilateral sacroiliitis. 1 cm
intramuscular lipoma left so was muscle.
IMPRESSION: 1. Mild improvement in the degree of perinephric and periureteral
inflammation on the right, but with persistent moderate to severe
hydronephrosis. No calculus currently seen, but there is evidence of
inflammation involving the mucosa of the distal right ureter and
there is edematous change at the right ureterovesical junction.
2. New from the prior study, there is mucosal thickening and mild
surrounding inflammation involving the descending duodenum
consistent with duodenitis. There is mild mesenteric adenopathy now
adjacent to this portion of the duodenum. No evidence of perforation
identified.

## 2017-06-22 ENCOUNTER — Other Ambulatory Visit: Payer: Self-pay

## 2017-06-22 ENCOUNTER — Ambulatory Visit: Payer: Self-pay

## 2017-06-22 ENCOUNTER — Encounter (INDEPENDENT_AMBULATORY_CARE_PROVIDER_SITE_OTHER): Payer: Self-pay

## 2017-06-22 VITALS — BP 142/86

## 2017-06-22 DIAGNOSIS — Z79899 Other long term (current) drug therapy: Secondary | ICD-10-CM

## 2017-06-22 NOTE — Progress Notes (Signed)
Medication Management Clinic Visit Note  Patient: Jason [name] MRN: [id number] Date of Birth: 28-Aug-1965 PCP: Sandrea Hughsubio, Jessica, NP   Jason [name] 52 y.o. male presents for a medication therapy management visit today with the pharmacist.  BP (!) 142/86 (BP Location: Right Arm)   Patient Information   Past Medical History:  Diagnosis Date  . Anxiety   . Arthritis   . Chronic low back pain   . Chronic pain 2002  . Degenerative disc disease, lumbar   . Depression   . Diverticulosis of colon   . History of hiatal hernia   . Hypertension   . Incomplete right bundle branch block   . OSA on CPAP   . Right nephrolithiasis   . Urgency of urination   . Wears partial dentures    UPPER AND LOWER      Past Surgical History:  Procedure Laterality Date  . COLONOSCOPY W/ BIOPSIES AND POLYPECTOMY    . CYSTOSCOPY WITH HOLMIUM LASER LITHOTRIPSY Right 02/14/2015   Procedure: CYSTOSCOPY WITH HOLMIUM LASER LITHOTRIPSY;  Surgeon: Sebastian Acheheodore Manny, MD;  [facility] [place];  Service: Urology;  Laterality: Right;  . CYSTOSCOPY WITH RETROGRADE PYELOGRAM, URETEROSCOPY AND STENT PLACEMENT Right 02/14/2015   Procedure: CYSTOSCOPY WITH RETROGRADE PYELOGRAM, URETEROSCOPY AND STENT PLACEMENT;  Surgeon: Sebastian Acheheodore Manny, MD;  [facility] [place];  Service: Urology;  Laterality: Right;  . KIDNEY STONE SURGERY    . NO PAST SURGERIES    . STONE EXTRACTION WITH BASKET Right 02/14/2015   Procedure: STONE EXTRACTION WITH BASKET;  Surgeon: Sebastian Acheheodore Manny, MD;  [facility] ;  Service: Urology;  Laterality: Right;     Family History  Problem Relation Age of Onset  . Hypertension Mother   . Cancer Mother   . Hypertension Father   . Dementia Father     Outpatient Encounter Medications as of 06/22/2017  Medication Sig  . albuterol (PROVENTIL HFA;VENTOLIN HFA) 108 (90 Base) MCG/ACT inhaler Inhale 2 puffs into the lungs every 4 (four) hours as needed for wheezing  or shortness of breath (4-6 hours PRN).  Marland Kitchen. buPROPion (ZYBAN) 150 MG 12 hr tablet Take 150 mg by mouth 2 (two) times daily.  . citalopram (CELEXA) 40 MG tablet Take 40 mg by mouth daily.   . fluticasone (FLONASE) 50 MCG/ACT nasal spray Place 1 spray into both nostrils daily.  Marland Kitchen. gabapentin (NEURONTIN) 300 MG capsule Take 300 mg by mouth 3 (three) times daily.  . hydrochlorothiazide (HYDRODIURIL) 25 MG tablet Take 25 mg by mouth daily.  Marland Kitchen. lisinopril (PRINIVIL,ZESTRIL) 20 MG tablet Take 20 mg by mouth every morning.  . metoprolol succinate (TOPROL-XL) 25 MG 24 hr tablet Take 25 mg by mouth 2 (two) times daily.   Marland Kitchen. oxyCODONE-acetaminophen (PERCOCET/ROXICET) 5-325 MG per tablet Take 2 tablets by mouth every 4 (four) hours as needed for severe pain.  . tadalafil (CIALIS) 5 MG tablet Take 5 mg by mouth daily as needed for erectile dysfunction.  . tamsulosin (FLOMAX) 0.4 MG CAPS capsule Take 1 capsule (0.4 mg total) by mouth daily after breakfast. (Patient not taking: Reported on 06/22/2017)  . [DISCONTINUED] aspirin EC 81 MG tablet Take 81 mg by mouth daily.  . [DISCONTINUED] cetirizine (ZYRTEC) 10 MG tablet Take 10 mg by mouth daily.  . [DISCONTINUED] cyclobenzaprine (FLEXERIL) 10 MG tablet Take 10 mg by mouth at bedtime.  . [DISCONTINUED] naproxen (NAPROSYN) 500 MG tablet Take 500 mg by mouth daily as needed.  . [DISCONTINUED] simvastatin (ZOCOR)  20 MG tablet Take 20 mg by mouth daily.   No facility-administered encounter medications on file as of 06/22/2017.      Family Support: None. States that he does not have support and that it is difficult for him to open up to others/friends about his struggles.   Lifestyle Diet: 2-3 meals Breakfast: ham and eggs/cereal; does not typically eat  Lunch: frozen dinners or can of soup Dinner: frozen dinners or can of soup Drinks: soft drinks - regular     Current Exercise Habits: The patient does not participate in regular exercise at present  Exercise  limited by: neurologic condition(s)    Social History   Substance and Sexual Activity  Alcohol Use No  . Alcohol/week: 1.2 oz  . Types: 2 Shots of liquor per week  . Frequency: Never   Comment: Quit drinking about a year       Social History   Tobacco Use  Smoking Status Current Every Day Smoker  . Packs/day: 1.00  . Years: 40.00  . Pack years: 40.00  . Types: Cigarettes  Smokeless Tobacco Never Used      Health Maintenance  Topic Date Due  . HIV Screening  02/25/1980  . TETANUS/TDAP  02/25/1984  . COLONOSCOPY  02/25/2015  . INFLUENZA VACCINE  01/07/2017   Health Maintenance/Date Completed  Last ED visit: 3 yrs ago Last Visit to PCP: Seward Carol, sees every 3 month; Oct 2018 Next Visit to PCP: Jan 2019 Specialist Visit: Does not currently see any specialist but is trying to see a pain specialist Dental Exam: < 5 yrs ago Eye Exam: 5 yrs ago Prostate Exam: 2 yrs ago DEXA: Never had  Colonoscopy: 2018 - polyps removed  Flu Vaccine: Oct 2018 Pneumonia Vaccine: Never had    Assessment and Plan:  HTN: hydrochlorothiazide, metoprolol succinate. Patient states that he does not regularly check BP but that he is interested in starting to keep a log. I encouraged him to use the free BP monitor at [facility]. BP today was 142/86.   Chronic Pain: gabapentin, oxycodone/APAP. Patient stated that his pain is not under control and that he has chronic back pain issues. He stated that he is trying to get in to see a pain clinic specialist but that it is difficult to get to [facility].   Depression: citalopram. Patient states that he continues to struggle with his depression and has difficulty talking about it with others. He said that he isn't sleeping well. Patient is interested in investigating further mental health options and I gave him some information about [facility] and [facility].   Asthma: albuterol, flonase. Patient stated that this is well managed with current inhalers.     Patient  states that he is not compliant with medications. He states that he does have a pill box which I encouraged him to use. I also recommended setting an alarm to help him remember to fill pill box. Patient's mood is very depressed. He states that it makes it difficult for him to do day to day things and that it's harder to take care of himself. He also states that he is limited by his chronic pain. I encouraged patient to see pain specialist and mental health specialist. I scheduled a follow up appointment in 6 months.   Yolanda Bonine, PharmD Pharmacy Resident

## 2017-06-24 ENCOUNTER — Telehealth: Payer: Self-pay | Admitting: Pharmacist

## 2017-06-24 NOTE — Telephone Encounter (Signed)
06/24/17 Faxed [employer] refill request for Cialis 5mg  Take one tablet by mouth everyday, # 120.Forde RadonAJ

## 2017-08-10 ENCOUNTER — Telehealth: Payer: Self-pay | Admitting: Pharmacist

## 2017-08-10 NOTE — Telephone Encounter (Signed)
08/10/17 Placed refill online with [facility] for Ventolin HFA, to release 08/21/17, order# [id number].Forde [email or web address]

## 2017-08-13 ENCOUNTER — Telehealth: Payer: Self-pay | Admitting: Pharmacist

## 2017-08-13 NOTE — Telephone Encounter (Signed)
08/13/2017 11:58:29 AM - Flovent HFA 110 mcg refill  08/13/17 Placed refill online with [facility] for Flovent HFA 110mcg, will release 09/11/17, order# [id number].Forde [email or web address]

## 2017-08-17 ENCOUNTER — Telehealth: Payer: Self-pay | Admitting: Pharmacy Technician

## 2017-08-17 NOTE — Telephone Encounter (Signed)
Patient failed to provide 2019 financial documentation.  No additional medication assistance will be provided by [facility] without the required proof of income documentation.  Patient notified by letter.  Shirell Struthers J. Marche Hottenstein Care Manager [facility] 

## 2017-09-04 ENCOUNTER — Telehealth: Payer: Self-pay | Admitting: Pharmacy Technician

## 2017-09-04 NOTE — Telephone Encounter (Signed)
Received updated proof of income.  Patient eligible to receive medication assistance at [facility] through 2019, as long as eligibility requirements continue to be met.  [facility]

## 2017-12-24 ENCOUNTER — Encounter: Payer: Self-pay | Admitting: Pharmacist

## 2017-12-29 ENCOUNTER — Ambulatory Visit: Payer: Medicaid Other | Attending: Primary Care

## 2017-12-29 DIAGNOSIS — M6281 Muscle weakness (generalized): Secondary | ICD-10-CM | POA: Insufficient documentation

## 2017-12-29 DIAGNOSIS — G8929 Other chronic pain: Secondary | ICD-10-CM | POA: Diagnosis present

## 2017-12-29 DIAGNOSIS — R262 Difficulty in walking, not elsewhere classified: Secondary | ICD-10-CM | POA: Diagnosis present

## 2017-12-29 DIAGNOSIS — M545 Low back pain: Secondary | ICD-10-CM | POA: Diagnosis present

## 2017-12-29 NOTE — Patient Instructions (Addendum)
  Sitting with lumbar towel roll 2 min  Decreased back pain. Given as part of HEP. Pt verbalized undertanding.        Med[employer] Access Code: [email or web address]    Static Prone on Elbows  2 min x 2 for 2-3x/day with feet turned in

## 2017-12-29 NOTE — Therapy (Signed)
[facility] PHYSICAL AND SPORTS MEDICINE [address] [place], [place], [address] Phone: [phone]   Fax:  [phone]  Physical Therapy Evaluation  Patient Details  Name: Jason MessierMark C [name] MRN: [id number] Date of Birth: June 02, 1966 Referring Provider: Sandrea HughsJessica Rubio, FNP   Encounter Date: 12/29/2017  PT End of Session - 12/29/17 1408    Visit Number  1    Number of Visits  4    Date for PT Re-Evaluation  01/28/18    Authorization Type  1    Authorization Time Period  of 4 Medicaid    PT Start Time  1353    PT Stop Time  1510    PT Time Calculation (min)  77 min    Activity Tolerance  Patient tolerated treatment well    Behavior During Therapy  [facility] for tasks assessed/performed       Past Medical History:  Diagnosis Date  . Anxiety   . Arthritis   . Chronic low back pain   . Chronic pain 2002  . Degenerative disc disease, lumbar   . Depression   . Diverticulosis of colon   . History of hiatal hernia   . Hypertension   . Incomplete right bundle branch block   . OSA on CPAP   . Right nephrolithiasis   . Urgency of urination   . Wears partial dentures    UPPER AND LOWER    Past Surgical History:  Procedure Laterality Date  . COLONOSCOPY W/ BIOPSIES AND POLYPECTOMY    . CYSTOSCOPY WITH HOLMIUM LASER LITHOTRIPSY Right 02/14/2015   Procedure: CYSTOSCOPY WITH HOLMIUM LASER LITHOTRIPSY;  Surgeon: Sebastian Acheheodore Manny, MD;  [facility] [place];  Service: Urology;  Laterality: Right;  . CYSTOSCOPY WITH RETROGRADE PYELOGRAM, URETEROSCOPY AND STENT PLACEMENT Right 02/14/2015   Procedure: CYSTOSCOPY WITH RETROGRADE PYELOGRAM, URETEROSCOPY AND STENT PLACEMENT;  Surgeon: Sebastian Acheheodore Manny, MD;  [facility] [place];  Service: Urology;  Laterality: Right;  . KIDNEY STONE SURGERY    . NO PAST SURGERIES    . STONE EXTRACTION WITH BASKET Right 02/14/2015   Procedure: STONE EXTRACTION WITH BASKET;  Surgeon: Sebastian Acheheodore Manny, MD;   [facility] [place];  Service: Urology;  Laterality: Right;    There were no vitals filed for this visit.   Subjective Assessment - 12/29/17 1408    Subjective  Back pain (bilateral low back pain): 2/10 currently (pt sitting on a chair), 8/10 at worst for the past 2 months (after a day of moving around). R ankle pain:  9/10 at worst for the past month (due to being on his feet a lot; pain began about 3 months which might have been due to weight gain).     Pertinent History  Chronic low back pain. Back pain began about 12 years ago in 2007. 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 Could not move afterwards. Went to urgent care and was diagnosed with arthritis. Also has bilateral foot pain including his ankles. Has an appointment with podiatry. Trying to stay off his feet as much as possible. Also quit smoking but gained 10 lbs. Seems like everytime he quits, he gets 10 lbs. Also has an inversion table this helps his back. Currently has difficulty putting his socks on. Also goes to a chiropractor which helps out too.  [facility] but had to stop because it was too far.  Doing  the exercise when he is on all fours and pushing up helped. Has a hard time bending over to put his socks and shoes on. Unable to hold his bowel at times when he needs to go. Happened about a week ago. His doctor does not know about that. Able to hold his bowels if it is not loose. Has accidents when his bowel is loose/diarrhea. Able to hold urine. Denies saddle anesthesia.     Patient Stated Goals  Goals: be better able to put his socks on.     Currently in Pain?  Yes    Pain Score  2     Pain Location  Back    Pain Orientation  Right;Left;Posterior;Lower    Pain Type  Chronic pain    Pain Onset  More than a month ago    Pain Frequency  Occasional    Aggravating Factors   bending over activities such as doning and doffing socks and shoes, and  lifting objects from the floor.  R ankle: walking about 1/4 of a mile.     Pain Relieving Factors  turning his body, stretching his trunk to the side, cold and heat.          [facility] PT Assessment - 12/29/17 1418      Assessment   Medical Diagnosis  Chronic back pain    Referring Provider  Sandrea Hughs, FNP    Onset Date/Surgical Date  12/04/17    Prior Therapy  Pt participated in PT an hour away 3 years ago but had to stop because it was too far. Also particpated in chiropractic treatment which helped.       Precautions   Precaution Comments  no known precautions       Restrictions   Other Position/Activity Restrictions  no known restrictions      Balance Screen   Has the patient fallen in the past 6 months  Yes    How many times?  2 in 3 months; trying to stand up from bending over    Has the patient had a decrease in activity level because of a fear of falling?   No    Is the patient reluctant to leave their home because of a fear of falling?   No      Home Environment   Additional Comments  Pt lives in a story  home, 4 steps to enter front door, no rails, 6 steps to enter back door, no rails. Pt lives alone.       Prior Function   Vocation Requirements  PLOF: better able to pick up stuff. Pt used to work with [employer].       Observation/Other Assessments   Observations  (-) Repeated flexion test. (+) long sit test R LE suggeting anterior nutation of R innominate.     Modified Oswertry  66%      Posture/Postural Control   Posture Comments  very slight R lateral shift, bilaterally protracted shoulders and neck, bilateral hip ER, decreased lordosis,       AROM   Lumbar Flexion  limited     Lumbar Extension  limited, relieves pressure off the back     Lumbar - Right Side Bend  WFL     Lumbar - Left Side Bend  WFL    Lumbar - Right Rotation  WFL performed in sitting     Lumbar - Left Rotation  WFL performed in sitting       Strength   Right  Hip Flexion  4/5    Right  Hip Extension  4-/5    Right Hip ABduction  4/5    Left Hip Flexion  4/5    Left Hip Extension  4/5    Left Hip ABduction  4/5    Right Knee Flexion  4-/5    Right Knee Extension  5/5    Left Knee Flexion  4-/5    Left Knee Extension  5/5      Ambulation/Gait   Gait Comments  decreased stance L LE, decreased trunk rotation. R ankle discomfort.  Decreased stance R LE when R ankle bothers him.                 Objective measurements completed on examination: See above findings.   Blood pressure L arm sitting: 143/84, HR 93, mechanically taken, normal cuff     Therapeutic exercise.   Sitting with lumbar towel roll 2 min  Decreased back pain. Given as part of HEP. Pt verbalized undertanding.    Prone on elbows x 2 minutes with bilateral hip IR with deep breaths   Improved exercise technique, movement at target joints, use of target muscles after mod verbal, visual, tactile cues.     Patient is a 52 year old male who came to physical therapy secondary to chronic back pain. He also presents with limited lumbar AROM, extension preference, bilateral hip weakness, altered gait pattern and posture, positive special test suggesting lumbopelvic involvement, and difficulty performing functional tasks such as donning and doffing his socks and shoes as well as activities which involve bending over. Patient will benefit from skilled physical therapy services to address the aforementioned deficits.          PT Education - 12/29/17 1952    Education Details  Ther-ex, HEP, plan of care    Person(s) Educated  Patient    Methods  Explanation;Demonstration;Tactile cues;Verbal cues;Handout    Comprehension  Returned demonstration;Verbalized understanding       PT Short Term Goals - 12/29/17 1911      PT SHORT TERM GOAL #1   Title  Patient will be independent with his HEP to promote ability to perform tasks such as picking up items from the floor or donning and doffing his socks and  shoes with less back pain.     Baseline  Pt has started a limited home exercise program. (12/29/2017)    Time  3    Period  Weeks    Status  New    Target Date  01/21/18        PT Long Term Goals - 12/29/17 1912      PT LONG TERM GOAL #1   Title  Patient will have a decrease in back pain to 4/10 or less at worst to promote ability to perform tasks such as picking up items from the floor or donning and doffing his socks and shoes with less back pain.     Baseline  8/10 back pain at worst for the past month (12/29/2017)    Time  4    Period  Weeks    Status  New    Target Date  01/28/18      PT LONG TERM GOAL #2   Title  Patient will improve bilateral hip extension and abduction strength by at least 1/2 MMT grade to promote ability to perform standing tasks with less pain.     Baseline  Hip extension: 4-/5 R, 4/5 L, hip abduction: 4/5 R,  4/5 L (12/29/2017)    Time  4    Period  Weeks    Status  New    Target Date  01/28/18      PT LONG TERM GOAL #3   Title  Patient will improve his Modified Oswestry Low Back Pain Disability questionnaire score by at least 10% as a demonstration of improved function.     Baseline  66% (12/29/2017)    Time  4    Period  Weeks    Status  New    Target Date  01/28/18             Plan - 12/29/17 1859    Clinical Impression Statement  Patient is a 52 year old male who came to physical therapy secondary to chronic back pain. He also presents with limited lumbar AROM, extension preference, bilateral hip weakness, altered gait pattern and posture, positive special test suggesting lumbopelvic involvement, and difficulty performing functional tasks such as donning and doffing his socks and shoes as well as activities which involve bending over. Patient will benefit from skilled physical therapy services to address the aforementioned deficits.    History and Personal Factors relevant to plan of care:  Chronicity of condition, back pain, difficulty  performing tasks which involve lifting and bending over, multiple areas of pain (back, ankles).    Clinical Presentation  Stable    Clinical Presentation due to:  decreased back pain with exercises during session    Clinical Decision Making  Low    Rehab Potential  Fair    Clinical Impairments Affecting Rehab Potential  (-) chronicity of condition, multiple areas of pain; (+) motivted    PT Frequency  1x / week    PT Duration  4 weeks    PT Treatment/Interventions  Therapeutic activities;Therapeutic exercise;Neuromuscular re-education;Patient/family education;Manual techniques;Dry needling;Aquatic Therapy;Electrical Stimulation;Iontophoresis 4mg /ml Dexamethasone;Traction;Ultrasound;Gait training    PT Next Visit Plan  back extension, hip strengthening, ergonomics, manual techniques, modalities PRN    Consulted and Agree with Plan of Care  Patient       Patient will benefit from skilled therapeutic intervention in order to improve the following deficits and impairments:  Pain, Postural dysfunction, Improper body mechanics, Decreased strength, Difficulty walking, Decreased range of motion  Visit Diagnosis: Chronic low back pain, unspecified back pain laterality, with sciatica presence unspecified - Plan: PT plan of care cert/re-cert  Muscle weakness (generalized) - Plan: PT plan of care cert/re-cert  Difficulty in walking, not elsewhere classified - Plan: PT plan of care cert/re-cert     Problem List There are no active problems to display for this patient.   Loralyn Freshwater PT, DPT   12/29/2017, 7:56 PM  [facility] PHYSICAL AND SPORTS MEDICINE [address], [place], [address] Phone: [phone]   Fax:  [phone]  Name: Jason [name] MRN: [id number] Date of Birth: Dec 22, 1965

## 2018-01-05 ENCOUNTER — Encounter: Payer: Medicaid Other | Admitting: Physical Therapy

## 2018-01-05 ENCOUNTER — Ambulatory Visit: Payer: Medicaid Other

## 2018-01-06 ENCOUNTER — Ambulatory Visit: Payer: Medicaid Other

## 2018-01-07 ENCOUNTER — Encounter: Payer: Medicaid Other | Admitting: Physical Therapy

## 2018-01-14 ENCOUNTER — Telehealth: Payer: Self-pay

## 2018-01-14 ENCOUNTER — Ambulatory Visit: Payer: Medicaid Other | Attending: Primary Care

## 2018-01-14 DIAGNOSIS — G8929 Other chronic pain: Secondary | ICD-10-CM | POA: Insufficient documentation

## 2018-01-14 DIAGNOSIS — M6281 Muscle weakness (generalized): Secondary | ICD-10-CM | POA: Insufficient documentation

## 2018-01-14 DIAGNOSIS — R262 Difficulty in walking, not elsewhere classified: Secondary | ICD-10-CM | POA: Insufficient documentation

## 2018-01-14 DIAGNOSIS — M545 Low back pain: Secondary | ICD-10-CM | POA: Insufficient documentation

## 2018-01-14 NOTE — Telephone Encounter (Signed)
No show. Called patient who said that he is unable to make it. Will be able to come to his next scheduled appointment.

## 2018-01-18 ENCOUNTER — Encounter: Payer: Self-pay | Admitting: Student in an Organized Health Care Education/Training Program

## 2018-01-18 ENCOUNTER — Ambulatory Visit
Admission: RE | Admit: 2018-01-18 | Discharge: 2018-01-18 | Disposition: A | Discharge status: Home / Self Care | Payer: Medicaid Other | Source: Ambulatory Visit | Attending: Student in an Organized Health Care Education/Training Program | Admitting: Student in an Organized Health Care Education/Training Program

## 2018-01-18 ENCOUNTER — Ambulatory Visit (HOSPITAL_BASED_OUTPATIENT_CLINIC_OR_DEPARTMENT_OTHER): Payer: Medicaid Other | Admitting: Student in an Organized Health Care Education/Training Program

## 2018-01-18 VITALS — BP 192/128 | HR 92 | Temp 98.5°F | Resp 16 | Ht 68.0 in | Wt 230.0 lb

## 2018-01-18 DIAGNOSIS — M47818 Spondylosis without myelopathy or radiculopathy, sacral and sacrococcygeal region: Secondary | ICD-10-CM | POA: Diagnosis not present

## 2018-01-18 DIAGNOSIS — M5136 Other intervertebral disc degeneration, lumbar region: Secondary | ICD-10-CM | POA: Diagnosis not present

## 2018-01-18 DIAGNOSIS — M5441 Lumbago with sciatica, right side: Secondary | ICD-10-CM | POA: Insufficient documentation

## 2018-01-18 DIAGNOSIS — M549 Dorsalgia, unspecified: Secondary | ICD-10-CM | POA: Diagnosis not present

## 2018-01-18 DIAGNOSIS — M25571 Pain in right ankle and joints of right foot: Secondary | ICD-10-CM

## 2018-01-18 DIAGNOSIS — G8929 Other chronic pain: Secondary | ICD-10-CM | POA: Diagnosis not present

## 2018-01-18 DIAGNOSIS — G894 Chronic pain syndrome: Secondary | ICD-10-CM | POA: Diagnosis not present

## 2018-01-18 DIAGNOSIS — Z9114 Patient's other noncompliance with medication regimen: Secondary | ICD-10-CM

## 2018-01-18 MED ORDER — DICLOFENAC SODIUM 75 MG PO TBEC: 75.0000 mg | DELAYED_RELEASE_TABLET | Freq: Two times a day (BID) | ORAL | 0 refills | Status: DC

## 2018-01-18 MED ORDER — PREGABALIN 25 MG PO CAPS: 25.0000 mg | ORAL_CAPSULE | Freq: Three times a day (TID) | ORAL | 0 refills | Status: DC

## 2018-01-18 NOTE — Progress Notes (Signed)
Patient's Name: Jason [name]  MRN: [id number]  Referring Provider: Freddy Finner, NP  DOB: Apr 08, 1966  PCP: Freddy Finner, NP  DOS: 01/18/2018  Note by: Gillis Santa, MD  Service setting: Ambulatory outpatient  Specialty: Interventional Pain Management  Location: [facility] (AMB) Pain Management Facility  Visit type: Initial Patient Evaluation  Patient type: New Patient   Primary Reason(s) for Visit: Encounter for initial evaluation of one or more chronic problems (new to examiner) potentially causing chronic pain, and posing a threat to normal musculoskeletal function. (Level of risk: High) CC: Back Pain (lower bilateral ) and Ankle Pain (right )  HPI  Jason [name] is a 52 y.o. year old, male patient, who comes today to see [place] for the first time for an initial evaluation of his chronic pain. He has Musculoskeletal back pain; Depression; OSA (obstructive sleep apnea); HTN (hypertension); Chronic pain syndrome; and Chronic pain of right ankle on their problem list. Today he comes in for evaluation of his Back Pain (lower bilateral ) and Ankle Pain (right )  Pain Assessment: Location: Lower, Left, Right(right ankle ) Back Radiating: down both legs mainly on the right  Onset: More than a month ago Duration: Chronic pain Quality: Discomfort, Sharp, Sore, Constant Severity: 7 /10 (subjective, self-reported pain score)  Note: Reported level is inconsistent with clinical observations. Clinically the patient looks like a 2/10 A 2/10 is viewed as "Mild to Moderate" and described as noticeable and distracting. Impossible to hide from other people. More frequent flare-ups. Still possible to adapt and function close to normal. It can be very annoying and may have occasional stronger flare-ups. With discipline, patients may get used to it and adapt.       When using our objective Pain Scale, levels between 6 and 10/10 are said to belong in an emergency room, as it progressively worsens from a 6/10, described as  severely limiting, requiring emergency care not usually available at an outpatient pain management facility. At a 6/10 level, communication becomes difficult and requires great effort. Assistance to reach the emergency department may be required. Facial flushing and profuse sweating along with potentially dangerous increases in heart rate and blood pressure will be evident. Effect on ADL: sitting too long causes pain  Timing: Intermittent Modifying factors: chiropractor, hot shower, stretching  BP: (!) 192/128  HR: 92  Onset and Duration: Gradual and Present longer than 3 months Cause of pain: Work related accident or event Severity: Getting worse, NAS-11 at its worse: 9/10, NAS-11 at its best: 2/10, NAS-11 now: 7/10 and NAS-11 on the average: 5/10 Timing: Morning, Night and After activity or exercise Aggravating Factors: Bending, Climbing, Kneeling, Lifiting, Motion, Prolonged sitting, Prolonged standing, Squatting, Stooping , Walking, Walking uphill, Walking downhill and Working Alleviating Factors: Stretching, Cold packs, Hot packs, Lying down, Medications, Warm showers or baths, Chiropractic manipulations and physical therapy Associated Problems: Night-time cramps, Depression, Erectile dysfunction, Fatigue, Personality changes, Sweating, Temperature changes, Tingling and Pain that wakes patient up Quality of Pain: Aching, Agonizing, Annoying, Cramping, Deep, Getting longer, Sharp, Shooting, Tender and Uncomfortable Previous Examinations or Tests: CT scan, X-rays and Orthopedic evaluation Previous Treatments: Chiropractic manipulations and Narcotic medications  The patient comes into the clinics today for the first time for a chronic pain management evaluation.   52 year old male who presents with a chief complaint of chronic back pain.  Patient was being treated at the [place] [facility] by Freddy Finner.  Patient was receiving Percocet, 5 mg 3 times daily as needed, quantity  90/month.  He states that this medication was becoming ineffective and he had told his primary care provider this however he was kept on the same dose.  Patient was treated at [facility] for septal abscess resulting from snorting his Percocet requiring hospitalization, I&D and IV antibiotics as well as antifungals.  This is concerning for substance abuse which I told the patient.  Patient was offered substance abuse treatment by his primary care provider but the patient declined.  Today I took the time to provide the patient with information regarding my pain practice. The patient was informed that my practice is divided into two sections: an interventional pain management section, as well as a completely separate and distinct medication management section. I explained that I have procedure days for my interventional therapies, and evaluation days for follow-ups and medication management. Because of the amount of documentation required during both, they are kept separated. This means that there is the possibility that he may be scheduled for a procedure on one day, and medication management the next. I have also informed him that because of staffing and facility limitations, I no longer take patients for medication management only. To illustrate the reasons for this, I gave the patient the example of surgeons, and how inappropriate it would be to refer a patient to his/her care, just to write for the post-surgical antibiotics on a surgery done by a different surgeon.   Because interventional pain management is my board-certified specialty, the patient was informed that joining my practice means that they are open to any and all interventional therapies. I made it clear that this does not mean that they will be forced to have any procedures done. What this means is that I believe interventional therapies to be essential part of the diagnosis and proper management of chronic pain conditions. Therefore, patients not  interested in these interventional alternatives will be better served under the care of a different practitioner.  The patient was also made aware of my Comprehensive Pain Management Safety Guidelines where by joining my practice, they limit all of their nerve blocks and joint injections to those done by our practice, for as long as we are retained to manage their care.   Historic Controlled Substance Pharmacotherapy Review  PMP and historical list of controlled substances: Percocet 5 mg 3 times daily as needed, quantity 90/month, MME equals 22.5 Pharmacodynamics: Desired effects: Analgesia: The patient reports <50% benefit. Reported improvement in function: The patient reports medication allows him to have a normal productive life, including accomplishing all ADLs. Clinically meaningful improvement in function (CMIF): Sustained CMIF goals met Perceived effectiveness: Described as relatively effective but with some room for improvement Undesirable effects: Side-effects or Adverse reactions: None reported Historical Monitoring: The patient  reports that he has current or past drug history. Drug: Marijuana. List of all UDS Test(s): No results found for: MDMA, COCAINSCRNUR, [place], [place], CANNABQUANT, [place], [place] List of other Serum/Urine Drug Screening Test(s):  No results found for: AMPHSCRSER, BARBSCRSER, BENZOSCRSER, COCAINSCRSER, COCAINSCRNUR, PCPSCRSER, PCPQUANT, THCSCRSER, THCU, CANNABQUANT, OPIATESCRSER, OXYSCRSER, PROPOXSCRSER, ETH Historical Background Evaluation: [place] PMP: Six (6) year initial data search conducted.             [place] Department of public safety, offender search: Editor, commissioning Information) Non-contributory Risk Assessment Profile: Aberrant behavior: diminished ability to recognize a problem with one's behavior or use of the medication, impaired control over use of medications, non-compliance with medical instructions on the proper use of the medication, non-compliance with  practice rules and regulations,  taking more medication than prescribed and unsafe use of medication Risk factors for fatal opioid overdose: age 81-44 years old, caucasian, history of non-compliance with medical advice, history of substance abuse and None identified today Fatal overdose hazard ratio (HR): Calculation deferred Non-fatal overdose hazard ratio (HR): Calculation deferred Risk of opioid abuse or dependence: 0.7-3.0% with doses ? 36 MME/day and 6.1-26% with doses ? 120 MME/day. Substance use disorder (SUD) risk level: High Opioid risk tool (ORT) (Total Score): 7 Opioid Risk Tool - 01/18/18 1344      Family History of Substance Abuse   Alcohol  Negative    Illegal Drugs  Negative    Rx Drugs  Negative      Personal History of Substance Abuse   Alcohol  Negative    Illegal Drugs  Positive Male or Male   cocaine 1999   Rx Drugs  Negative      Psychological Disease   Psychological Disease  Positive    ADD  Negative    OCD  Negative    Bipolar  Negative    Schizophrenia  Negative    Depression  Positive   taking bupropion for smoking cessation and celexia for depression      Total Score   Opioid Risk Tool Scoring  7    Opioid Risk Interpretation  Moderate Risk      ORT Scoring interpretation table:  Score <3 = Low Risk for SUD  Score between 4-7 = Moderate Risk for SUD  Score >8 = High Risk for Opioid Abuse   PHQ-2 Depression Scale:  Total score: 0  PHQ-2 Scoring interpretation table: (Score and probability of major depressive disorder)  Score 0 = No depression  Score 1 = 15.4% Probability  Score 2 = 21.1% Probability  Score 3 = 38.4% Probability  Score 4 = 45.5% Probability  Score 5 = 56.4% Probability  Score 6 = 78.6% Probability   PHQ-9 Depression Scale:  Total score: 0  PHQ-9 Scoring interpretation table:  Score 0-4 = No depression  Score 5-9 = Mild depression  Score 10-14 = Moderate depression  Score 15-19 = Moderately severe depression  Score  20-27 = Severe depression (2.4 times higher risk of SUD and 2.89 times higher risk of overuse)   Pharmacologic Plan: Non-opioid analgesic therapy offered.            Initial impression: High risk for opiate therapy.  Meds   Current Outpatient Medications:  .  albuterol (PROVENTIL HFA;VENTOLIN HFA) 108 (90 Base) MCG/ACT inhaler, Inhale 2 puffs into the lungs every 4 (four) hours as needed for wheezing or shortness of breath (4-6 hours PRN)., Disp: , Rfl:  .  buPROPion (ZYBAN) 150 MG 12 hr tablet, Take 150 mg by mouth 2 (two) times daily., Disp: , Rfl:  .  citalopram (CELEXA) 40 MG tablet, Take 40 mg by mouth daily. , Disp: , Rfl:  .  hydrochlorothiazide (HYDRODIURIL) 25 MG tablet, Take 25 mg by mouth daily., Disp: , Rfl:  .  lisinopril (PRINIVIL,ZESTRIL) 20 MG tablet, Take 20 mg by mouth every morning., Disp: , Rfl:  .  metoprolol succinate (TOPROL-XL) 25 MG 24 hr tablet, Take 25 mg by mouth 2 (two) times daily. , Disp: , Rfl:  .  tadalafil (CIALIS) 5 MG tablet, Take 5 mg by mouth daily as needed for erectile dysfunction., Disp: , Rfl:  .  diclofenac (VOLTAREN) 75 MG EC tablet, Take 1 tablet (75 mg total) by mouth 2 (two) times daily., Disp: 60  tablet, Rfl: 0 .  fluticasone (FLONASE) 50 MCG/ACT nasal spray, Place 1 spray into both nostrils daily., Disp: , Rfl:  .  gabapentin (NEURONTIN) 300 MG capsule, Take 300 mg by mouth 3 (three) times daily., Disp: , Rfl:  .  oxyCODONE-acetaminophen (PERCOCET/ROXICET) 5-325 MG per tablet, Take 2 tablets by mouth every 4 (four) hours as needed for severe pain., Disp: , Rfl:  .  pregabalin (LYRICA) 25 MG capsule, Take 1 capsule (25 mg total) by mouth 3 (three) times daily., Disp: 90 capsule, Rfl: 0  Imaging Review    ROS  Cardiovascular: No reported cardiovascular signs or symptoms such as High blood pressure, coronary artery disease, abnormal heart rate or rhythm, heart attack, blood thinner therapy or heart weakness and/or failure Pulmonary or  Respiratory: Snoring  Neurological: No reported neurological signs or symptoms such as seizures, abnormal skin sensations, urinary and/or fecal incontinence, being born with an abnormal open spine and/or a tethered spinal cord Review of Past Neurological Studies: No results found for this or any previous visit. Psychological-Psychiatric: Depressed Gastrointestinal: Reflux or heatburn Genitourinary: Passing kidney stones Hematological: No reported hematological signs or symptoms such as prolonged bleeding, low or poor functioning platelets, bruising or bleeding easily, hereditary bleeding problems, low energy levels due to low hemoglobin or being anemic Endocrine: No reported endocrine signs or symptoms such as high or low blood sugar, rapid heart rate due to high thyroid levels, obesity or weight gain due to slow thyroid or thyroid disease Rheumatologic: Rheumatoid arthritis Musculoskeletal: Negative for myasthenia gravis, muscular dystrophy, multiple sclerosis or malignant hyperthermia Work History: Disabled  Allergies  Mr. Toscano has No Known Allergies.  Laboratory Chemistry  Inflammation Markers (CRP: Acute Phase) (ESR: Chronic Phase) No results found for: CRP, ESRSEDRATE, LATICACIDVEN                       Rheumatology Markers No results found for: RF, ANA, LABURIC, URICUR, LYMEIGGIGMAB, LYMEABIGMQN, HLAB27                      Renal Function Markers Lab Results  Component Value Date   BUN 20 02/20/2015   CREATININE 1.12 02/20/2015   GFRAA >60 02/20/2015   GFRNONAA >60 02/20/2015                             Hepatic Function Markers Lab Results  Component Value Date   AST 44 (H) 02/20/2015   ALT 64 (H) 02/20/2015   ALBUMIN 4.2 02/20/2015   ALKPHOS 87 02/20/2015   LIPASE 15 (L) 02/20/2015                        Electrolytes Lab Results  Component Value Date   NA 132 (L) 02/20/2015   K 3.8 02/20/2015   CL 94 (L) 02/20/2015   CALCIUM 9.4 02/20/2015                         Neuropathy Markers No results found for: VITAMINB12, FOLATE, HGBA1C, HIV                      Bone Pathology Markers No results found for: [place], [id number], [id number], [id number], 25OHVITD1, 25OHVITD2, 25OHVITD3, TESTOFREE, TESTOSTERONE                       Coagulation Parameters Lab Results  Component Value Date   PLT 330 02/20/2015                        Cardiovascular Markers Lab Results  Component Value Date   TROPONINI <0.03 02/05/2015   HGB 18.0 02/20/2015   HCT 52.3 (H) 02/20/2015                         CA Markers No results found for: CEA, CA125, LABCA2                      Note: Lab results reviewed.  [place]  Drug: Mr. Madariaga  reports that he has current or past drug history. Drug: Marijuana. Alcohol:  reports that he does not drink alcohol. Tobacco:  reports that he has quit smoking. He smoked 0.00 packs per day. He has never used smokeless tobacco. Medical:  has a past medical history of Anxiety, Arthritis, Chronic low back pain, Chronic pain (2002), Degenerative disc disease, lumbar, Depression, Diverticulosis of colon, History of hiatal hernia, Hypertension, Incomplete right bundle branch block, OSA on CPAP, Right nephrolithiasis, Urgency of urination, and Wears partial dentures. Family: family history includes Cancer in his mother; Dementia in his father; Hypertension in his father and mother.  Past Surgical History:  Procedure Laterality Date  . COLONOSCOPY W/ BIOPSIES AND POLYPECTOMY    . CYSTOSCOPY WITH HOLMIUM LASER LITHOTRIPSY Right 02/14/2015   Procedure: CYSTOSCOPY WITH HOLMIUM LASER LITHOTRIPSY;  Surgeon: Alexis Frock, MD;  Location: [facility];  Service: Urology;  Laterality: Right;  . CYSTOSCOPY WITH RETROGRADE PYELOGRAM, URETEROSCOPY AND STENT PLACEMENT Right 02/14/2015   Procedure: CYSTOSCOPY WITH RETROGRADE PYELOGRAM, URETEROSCOPY AND STENT PLACEMENT;  Surgeon: Alexis Frock, MD;  Location: [facility];  Service:  Urology;  Laterality: Right;  . KIDNEY STONE SURGERY    . NO PAST SURGERIES    . STONE EXTRACTION WITH BASKET Right 02/14/2015   Procedure: STONE EXTRACTION WITH BASKET;  Surgeon: Alexis Frock, MD;  Location: [facility];  Service: Urology;  Laterality: Right;   Active Ambulatory Problems    Diagnosis Date Noted  . Musculoskeletal back pain 09/08/2013  . Depression 09/08/2013  . OSA (obstructive sleep apnea) 12/20/2014  . HTN (hypertension) 09/08/2013  . Chronic pain syndrome 01/18/2018  . Chronic pain of right ankle 01/18/2018   Resolved Ambulatory Problems    Diagnosis Date Noted  . No Resolved Ambulatory Problems   Past Medical History:  Diagnosis Date  . Anxiety   . Arthritis   . Chronic low back pain   . Chronic pain 2002  . Degenerative disc disease, lumbar   . Diverticulosis of colon   . History of hiatal hernia   . Hypertension   . Incomplete right bundle branch block   . OSA on CPAP   . Right nephrolithiasis   . Urgency of urination   . Wears partial dentures    Constitutional Exam  General appearance: Well nourished, well developed, and well hydrated. In no apparent acute distress Vitals:   01/18/18 1337 01/18/18 1340  BP: (!) 210/129 (!) 192/128  Pulse: 92   Resp: 16   Temp: 98.5 F (36.9 C)   TempSrc: Oral   SpO2: 98%   Weight: 230 lb (104.3 kg)   Height: 5' 8"  (1.727 m)    BMI Assessment: Estimated body mass index is 34.97 kg/m as calculated from the following:   Height as of  this encounter: 5' 8"  (1.727 m).   Weight as of this encounter: 230 lb (104.3 kg).  BMI interpretation table: BMI level Category Range association with higher incidence of chronic pain  <18 kg/m2 Underweight   18.5-24.9 kg/m2 Ideal body weight   25-29.9 kg/m2 Overweight Increased incidence by 20%  30-34.9 kg/m2 Obese (Class I) Increased incidence by 68%  35-39.9 kg/m2 Severe obesity (Class II) Increased incidence by 136%  >40 kg/m2 Extreme obesity (Class  III) Increased incidence by 254%   Patient's current BMI Ideal Body weight  Body mass index is 34.97 kg/m. Ideal body weight: 68.4 kg (150 lb 12.7 oz) Adjusted ideal body weight: 82.8 kg (182 lb 7.6 oz)   BMI Readings from Last 4 Encounters:  01/18/18 34.97 kg/m  02/02/17 33.91 kg/m  02/20/15 30.71 kg/m  02/14/15 33.30 kg/m   Wt Readings from Last 4 Encounters:  01/18/18 230 lb (104.3 kg)  02/02/17 223 lb (101.2 kg)  02/20/15 202 lb (91.6 kg)  02/14/15 219 lb (99.3 kg)  Psych/Mental status: Alert, oriented x 3 (person, place, & time)       Eyes: PERLA Respiratory: No evidence of acute respiratory distress  Cervical Spine Area Exam  Skin & Axial Inspection: No masses, redness, edema, swelling, or associated skin lesions Alignment: Symmetrical Functional ROM: Unrestricted ROM      Stability: No instability detected Muscle Tone/Strength: Functionally intact. No obvious neuro-muscular anomalies detected. Sensory (Neurological): Unimpaired Palpation: No palpable anomalies              Upper Extremity (UE) Exam    Side: Right upper extremity  Side: Left upper extremity  Skin & Extremity Inspection: Skin color, temperature, and hair growth are WNL. No peripheral edema or cyanosis. No masses, redness, swelling, asymmetry, or associated skin lesions. No contractures.  Skin & Extremity Inspection: Skin color, temperature, and hair growth are WNL. No peripheral edema or cyanosis. No masses, redness, swelling, asymmetry, or associated skin lesions. No contractures.  Functional ROM: Unrestricted ROM          Functional ROM: Unrestricted ROM          Muscle Tone/Strength: Functionally intact. No obvious neuro-muscular anomalies detected.  Muscle Tone/Strength: Functionally intact. No obvious neuro-muscular anomalies detected.  Sensory (Neurological): Unimpaired          Sensory (Neurological): Unimpaired          Palpation: No palpable anomalies              Palpation: No palpable  anomalies              Provocative Test(s):  Phalen's test: deferred Tinel's test: deferred Apley's scratch test (touch opposite shoulder):  Action 1 (Across chest): deferred Action 2 (Overhead): deferred Action 3 (LB reach): deferred   Provocative Test(s):  Phalen's test: deferred Tinel's test: deferred Apley's scratch test (touch opposite shoulder):  Action 1 (Across chest): deferred Action 2 (Overhead): deferred Action 3 (LB reach): deferred    Thoracic Spine Area Exam  Skin & Axial Inspection: No masses, redness, or swelling Alignment: Symmetrical Functional ROM: Unrestricted ROM Stability: No instability detected Muscle Tone/Strength: Functionally intact. No obvious neuro-muscular anomalies detected. Sensory (Neurological): Unimpaired Muscle strength & Tone: No palpable anomalies  Lumbar Spine Area Exam  Skin & Axial Inspection: No masses, redness, or swelling Alignment: Symmetrical Functional ROM: Unrestricted ROM       Stability: No instability detected Muscle Tone/Strength: Functionally intact. No obvious neuro-muscular anomalies detected. Sensory (Neurological): Musculoskeletal pain pattern Palpation: No palpable  anomalies       Provocative Tests: Hyperextension/rotation test: (-) bilaterally for facet joint pain. Lumbar quadrant test (Kemp's test): deferred today       Patrick's Maneuver: deferred today                   FABER test: deferred today                   S-I anterior distraction/compression test: deferred today         S-I lateral compression test: deferred today         S-I Thigh-thrust test: deferred today         S-I Gaenslen's test: deferred today          Gait & Posture Assessment  Ambulation: Unassisted Gait: Relatively normal for age and body habitus Posture: WNL   Lower Extremity Exam    Side: Right lower extremity  Side: Left lower extremity  Stability: No instability observed          Stability: No instability observed          Skin &  Extremity Inspection: Skin color, temperature, and hair growth are WNL. No peripheral edema or cyanosis. No masses, redness, swelling, asymmetry, or associated skin lesions. No contractures.  Skin & Extremity Inspection: Skin color, temperature, and hair growth are WNL. No peripheral edema or cyanosis. No masses, redness, swelling, asymmetry, or associated skin lesions. No contractures.  Functional ROM: Unrestricted ROM                  Functional ROM: Unrestricted ROM                  Muscle Tone/Strength: Functionally intact. No obvious neuro-muscular anomalies detected.  Muscle Tone/Strength: Functionally intact. No obvious neuro-muscular anomalies detected.  Sensory (Neurological): Unimpaired  Sensory (Neurological): Unimpaired  Palpation: No palpable anomalies  Palpation: No palpable anomalies   Assessment  Primary Diagnosis & Pertinent Problem List: The primary encounter diagnosis was Chronic bilateral low back pain with right-sided sciatica. Diagnoses of Chronic pain of right ankle, Musculoskeletal back pain, Chronic pain syndrome, and Controlled substance agreement broken were also pertinent to this visit.  Visit Diagnosis (New problems to examiner): 1. Chronic bilateral low back pain with right-sided sciatica   2. Chronic pain of right ankle   3. Musculoskeletal back pain   4. Chronic pain syndrome   5. Controlled substance agreement broken    General Recommendations: The pain condition that the patient suffers from is best treated with a multidisciplinary approach that involves an increase in physical activity to prevent de-conditioning and worsening of the pain cycle, as well as psychological counseling (formal and/or informal) to address the co-morbid psychological affects of pain. Treatment will often involve judicious use of pain medications and interventional procedures to decrease the pain, allowing the patient to participate in the physical activity that will ultimately produce  long-lasting pain reductions. The goal of the multidisciplinary approach is to return the patient to a higher level of overall function and to restore their ability to perform activities of daily living.  52 year old male who presents with a chief complaint of chronic back pain.  Patient was being treated at the [place] [facility] by Freddy Finner.  Patient was receiving Percocet, 5 mg 3 times daily as needed, quantity 90/month.  He states that this medication was becoming ineffective and he had told his primary care provider this however he was kept on the same dose.  Patient was treated at [facility] for septal abscess resulting from snorting his Percocet requiring hospitalization, I&D and IV antibiotics as well as antifungals.  This is concerning for substance abuse which I told the patient.  Patient was offered substance abuse treatment by his primary care provider but the patient declined.  Had extensive discussion with the patient that he would not be a candidate for chronic opioid therapy at this clinic given his history of snorting Percocet.  Patient is adamant that this was only on a one-time basis since he was having more pain and that his primary care provider would not increase his Percocet.  He was treated for a septal abscess at [facility] and was on IV antibiotics as well as antifungals for his abscess.  I to offer the patient resources for substance abuse however the patient declined.  The patient is high risk for substance abuse/misuse.  We will focus on non-opioid-based pain management only which I have made very clear to the patient.  Plan: -Non-opioid-based pain management only.  Patient will not be a candidate for chronic opioid therapy at this clinic -Lumbar spine and SI joint x-rays for low back and hip pain -Right ankle x-ray for right ankle pain -Prescriptions for Lyrica and diclofenac as below.  Future considerations (pending imaging): -Lumbar MRI -Lumbar facet medial branch  nerve blocks -SI joint injection  Problem-specific plan: No problem-specific Assessment & Plan notes found for this encounter.  Ordered Lab-work, Procedure(s), Referral(s), & Consult(s): Orders Placed This Encounter  Procedures  . DG Lumbar Spine Complete W/Bend  . DG Si Joints  . DG Ankle Complete Right   Pharmacotherapy (current): Medications ordered:  Meds ordered this encounter  Medications  . pregabalin (LYRICA) 25 MG capsule    Sig: Take 1 capsule (25 mg total) by mouth 3 (three) times daily.    Dispense:  90 capsule    Refill:  0    Do not place this medication, or any other prescription from our practice, on "Automatic Refill". Patient may have prescription filled one day early if pharmacy is closed on scheduled refill date.  . diclofenac (VOLTAREN) 75 MG EC tablet    Sig: Take 1 tablet (75 mg total) by mouth 2 (two) times daily.    Dispense:  60 tablet    Refill:  0   Medications administered during this visit: Rector C. Waln had no medications administered during this visit.    Future Appointments  Date Time Provider [place]  01/20/2018  1:45 PM Madaline Savage, PT [facility] None  02/22/2018  1:00 PM Gillis Santa, MD [facility] None    Primary Care Physician: Freddy Finner, NP Location: [facility] [place] [facility] Note by: Gillis Santa, M.D, Date: 01/18/2018; Time: 2:51 PM  There are no Patient Instructions on file for this visit.

## 2018-01-18 NOTE — Progress Notes (Signed)
Safety precautions to be maintained throughout the outpatient stay will include: orient to surroundings, keep bed in low position, maintain call bell within reach at all times, provide assistance with transfer out of bed and ambulation.  

## 2018-01-20 ENCOUNTER — Telehealth: Payer: Self-pay

## 2018-01-20 ENCOUNTER — Ambulatory Visit: Payer: Medicaid Other

## 2018-01-20 DIAGNOSIS — M6281 Muscle weakness (generalized): Secondary | ICD-10-CM | POA: Diagnosis present

## 2018-01-20 DIAGNOSIS — M545 Low back pain: Principal | ICD-10-CM

## 2018-01-20 DIAGNOSIS — G8929 Other chronic pain: Secondary | ICD-10-CM | POA: Diagnosis present

## 2018-01-20 DIAGNOSIS — R262 Difficulty in walking, not elsewhere classified: Secondary | ICD-10-CM

## 2018-01-20 NOTE — Patient Instructions (Addendum)
  Standing, gently lean back for 5 seconds   Repeat 10 times,    Perform 3 sets daily.       Sitting on a chair    Perform tip toes and then point your toes up   Repeat 10 times, perform 3 sets daily

## 2018-01-20 NOTE — Therapy (Signed)
[facility] [address], [place], [address] Phone: [phone]   Fax:  [phone]  Physical Therapy Treatment  Patient Details  Name: Jason [name] MRN: [id number] Date of Birth: [birth year] Referring Provider: Sandrea Hughs, FNP   Encounter Date: 01/20/2018  PT End of Session - 01/20/18 1507    Visit Number  2    Number of Visits  4    Date for PT Re-Evaluation  01/28/18    Authorization Type  2    Authorization Time Period  of 4 Medicaid    PT Start Time  1508    PT Stop Time  1557    PT Time Calculation (min)  49 min    Activity Tolerance  Patient tolerated treatment well    Behavior During Therapy  [facility] for tasks assessed/performed       Past Medical History:  Diagnosis Date  . Anxiety   . Arthritis   . Chronic low back pain   . Chronic pain 2002  . Degenerative disc disease, lumbar   . Depression   . Diverticulosis of colon   . History of hiatal hernia   . Hypertension   . Incomplete right bundle branch block   . OSA on CPAP   . Right nephrolithiasis   . Urgency of urination   . Wears partial dentures    UPPER AND LOWER    Past Surgical History:  Procedure Laterality Date  . COLONOSCOPY W/ BIOPSIES AND POLYPECTOMY    . CYSTOSCOPY WITH HOLMIUM LASER LITHOTRIPSY Right 02/14/2015   Procedure: CYSTOSCOPY WITH HOLMIUM LASER LITHOTRIPSY;  Surgeon: Sebastian Ache, MD;  Location: [facility];  Service: Urology;  Laterality: Right;  . CYSTOSCOPY WITH RETROGRADE PYELOGRAM, URETEROSCOPY AND STENT PLACEMENT Right 02/14/2015   Procedure: CYSTOSCOPY WITH RETROGRADE PYELOGRAM, URETEROSCOPY AND STENT PLACEMENT;  Surgeon: Sebastian Ache, MD;  Location: [facility];  Service: Urology;  Laterality: Right;  . KIDNEY STONE SURGERY    . NO PAST SURGERIES    . STONE EXTRACTION WITH BASKET Right 02/14/2015   Procedure: STONE EXTRACTION WITH BASKET;  Surgeon: Sebastian Ache, MD;   Location: [facility];  Service: Urology;  Laterality: Right;    There were no vitals filed for this visit.  Subjective Assessment - 01/20/18 1509    Subjective  Pt states that his back is not too bad today. Has not been doing much for the past couple of days.  Has been doing his HEP. Still has a hard time reaching his feet.  4/10 back pain currently, 7/10 at worst for the past 7 days.  Pt states going to the beach Friday, coming back Monday.     Pertinent History  Chronic low back pain. Back pain began about 12 years ago in 2007. 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 Could not move afterwards. Went to urgent care and was diagnosed with arthritis. Also has bilateral foot pain including his ankles. Has an appointment with podiatry. Trying to stay off his feet as much as possible. Also quit smoking but gained 10 lbs. Seems like everytime he quits, he gets 10 lbs. Also has an inversion table this helps his back. Currently has difficulty putting his socks on. Also goes to a chiropractor which helps out too.  Went to 1 PT appointment at [facility] about 3 years ago but had to stop because it was too far.  Doing the  exercise when he is on all fours and pushing up helped. Has a hard time bending over to put his socks and shoes on. Unable to hold his bowel at times when he needs to go. Happened about a week ago. His doctor does not know about that. Able to hold his bowels if it is not loose. Has accidents when his bowel is loose/diarrhea. Able to hold urine. Denies saddle anesthesia.     Patient Stated Goals  Goals: be better able to put his socks on.     Currently in Pain?  Yes    Pain Score  4     Pain Onset  More than a month ago         [facility] - 01/20/18 1612      Observation/Other Assessments   Modified Oswertry  42%                           PT Education - 01/20/18 1515    Education Details  ther-ex, HEP    Person(s)  Educated  Patient    Methods  Explanation;Demonstration;Tactile cues;Verbal cues;Handout    Comprehension  Returned demonstration;Verbalized understanding         Objective       Therapeutic exercise.   Standing back extension 10x5 seconds for 3 sets.   Better ability to reach L foot afterwards  Seated LE neural flossing 3x  Pt states not taking his blood pressure medication because he forgets to do so.   Blood pressure L arm sitting, mechanically taken, normal cuff: 163/95, HR 84 No lightheadedness or dizziness   Pt was recommended to put reminders to take his BP medication. Pt verbalized understanding.   Manual muscle testing not performed secondary to elevated blood pressure levels  Seated bilateral ankle DF/PF to promote neural mobility10x3. Better able to reach L foot in sitting.   Standing forward step up onto Air Ex pad with R LE and L UE assist 10x3  R glute muscle use felt.   Blood pressure L arm sitting, mechanically taken, normal cuff: 158/94, HR 85  Seated R hip extension isometrics 10x5 seconds for 3 sets. No change in back pain   Improved exercise technique, movement at target joints, use of target muscles after min to mod verbal, visual, tactile cues.   Decreased back pain and improved ability to reach his feet in sitting following exercises to promote back extension and LE neural mobility. Pt also demonstrates improved ability to perform functional tasks since initial evaluation based on Modified Oswestry Low back pain disability questionnaire.               PT Short Term Goals - 12/29/17 1911      PT SHORT TERM GOAL #1   Title  Patient will be independent with his HEP to promote ability to perform tasks such as picking up items from the floor or donning and doffing his socks and shoes with less back pain.     Baseline  Pt has started a limited home exercise program. (12/29/2017)    Time  3    Period  Weeks    Status  New    Target Date   01/21/18        PT Long Term Goals - 12/29/17 1912      PT LONG TERM GOAL #1   Title  Patient will have a decrease in back pain to 4/10 or less at worst to  promote ability to perform tasks such as picking up items from the floor or donning and doffing his socks and shoes with less back pain.     Baseline  8/10 back pain at worst for the past month (12/29/2017)    Time  4    Period  Weeks    Status  New    Target Date  01/28/18      PT LONG TERM GOAL #2   Title  Patient will improve bilateral hip extension and abduction strength by at least 1/2 MMT grade to promote ability to perform standing tasks with less pain.     Baseline  Hip extension: 4-/5 R, 4/5 L, hip abduction: 4/5 R, 4/5 L (12/29/2017)    Time  4    Period  Weeks    Status  New    Target Date  01/28/18      PT LONG TERM GOAL #3   Title  Patient will improve his Modified Oswestry Low Back Pain Disability questionnaire score by at least 10% as a demonstration of improved function.     Baseline  66% (12/29/2017)    Time  4    Period  Weeks    Status  New    Target Date  01/28/18            Plan - 01/20/18 1516    Clinical Impression Statement  Decreased back pain and improved ability to reach his feet in sitting following exercises to promote back extension and LE neural mobility. Pt also demonstrates improved ability to perform functional tasks since initial evaluation based on Modified Oswestry Low back pain disability questionnaire.     Rehab Potential  Fair    Clinical Impairments Affecting Rehab Potential  (-) chronicity of condition, multiple areas of pain; (+) motivted    PT Frequency  1x / week    PT Duration  4 weeks    PT Treatment/Interventions  Therapeutic activities;Therapeutic exercise;Neuromuscular re-education;Patient/family education;Manual techniques;Dry needling;Aquatic Therapy;Electrical Stimulation;Iontophoresis 4mg /ml Dexamethasone;Traction;Ultrasound;Gait training    PT Next Visit Plan  back  extension, hip strengthening, ergonomics, manual techniques, modalities PRN    Consulted and Agree with Plan of Care  Patient       Patient will benefit from skilled therapeutic intervention in order to improve the following deficits and impairments:  Pain, Postural dysfunction, Improper body mechanics, Decreased strength, Difficulty walking, Decreased range of motion  Visit Diagnosis: Chronic low back pain, unspecified back pain laterality, with sciatica presence unspecified  Muscle weakness (generalized)  Difficulty in walking, not elsewhere classified     Problem List Patient Active Problem List   Diagnosis Date Noted  . Chronic pain syndrome 01/18/2018  . Chronic pain of right ankle 01/18/2018  . OSA (obstructive sleep apnea) 12/20/2014  . Musculoskeletal back pain 09/08/2013  . Depression 09/08/2013  . HTN (hypertension) 09/08/2013    Loralyn FreshwaterMiguel Navika Hoopes PT, DPT   01/20/2018, 4:20 PM  [facility] [address] [place], [place], [address] Phone: [phone]   Fax:  [phone]  Name: Jason [name] MRN: [id number] Date of Birth: January 04, 1966

## 2018-01-26 ENCOUNTER — Ambulatory Visit: Payer: Medicaid Other

## 2018-01-26 ENCOUNTER — Telehealth: Payer: Self-pay

## 2018-01-26 DIAGNOSIS — R262 Difficulty in walking, not elsewhere classified: Secondary | ICD-10-CM

## 2018-01-26 DIAGNOSIS — M6281 Muscle weakness (generalized): Secondary | ICD-10-CM

## 2018-01-26 DIAGNOSIS — G8929 Other chronic pain: Secondary | ICD-10-CM

## 2018-01-26 DIAGNOSIS — M545 Low back pain: Principal | ICD-10-CM

## 2018-01-26 NOTE — Telephone Encounter (Signed)
Pt went to pick up meds and pharmacy needs prior from Medicaid to fill 2 prescriptions, Please let pt know if we sent prior Auths

## 2018-01-26 NOTE — Patient Instructions (Addendum)
Scapular Retraction: Rowing (Eccentric) - Arms - 45 Degrees (Resistance Band)   Hold end of band in each hand. Squeeze shoulder blades together.    Hold for 5 seconds.  Use _yellow_______ resistance band. _10__ reps per set, _3__ sets per day. Copyright  VHI. All rights reserved.       Scapular Retraction (Standing)   With arms at sides, pinch shoulder blades together. Hold for 5 seconds. Repeat __10__ times per set. Do __3__ sets per session.     Copyright  VHI. All rights reserved.     Recommended for pt to continue using a towel roll behind his back to promote gentle extension and help decrease back pain and tightness. Pt verbalized understanding.

## 2018-01-26 NOTE — Therapy (Signed)
[place] [facility] [address] [place], [address] Phone: [phone]   Fax:  [phone]  Physical Therapy Treatment  Patient Details  Name: Jason [name] MRN: [id number] Date of Birth: 08/25/1965 Referring Provider: Freddy Finner, FNP   Encounter Date: 01/26/2018  PT End of Session - 01/26/18 1504    Visit Number  3    Number of Visits  14    Date for PT Re-Evaluation  03/04/18    Authorization Type  --    Authorization Time Period  Medicaid    PT Start Time  1504    PT Stop Time  1552    PT Time Calculation (min)  48 min    Activity Tolerance  Patient tolerated treatment well    Behavior During Therapy  [facility] for tasks assessed/performed       Past Medical History:  Diagnosis Date  . Anxiety   . Arthritis   . Chronic low back pain   . Chronic pain 2002  . Degenerative disc disease, lumbar   . Depression   . Diverticulosis of colon   . History of hiatal hernia   . Hypertension   . Incomplete right bundle branch block   . OSA on CPAP   . Right nephrolithiasis   . Urgency of urination   . Wears partial dentures    UPPER AND LOWER    Past Surgical History:  Procedure Laterality Date  . COLONOSCOPY W/ BIOPSIES AND POLYPECTOMY    . CYSTOSCOPY WITH HOLMIUM LASER LITHOTRIPSY Right 02/14/2015   Procedure: CYSTOSCOPY WITH HOLMIUM LASER LITHOTRIPSY;  Surgeon: Alexis Frock, MD;  Location: [facility];  Service: Urology;  Laterality: Right;  . CYSTOSCOPY WITH RETROGRADE PYELOGRAM, URETEROSCOPY AND STENT PLACEMENT Right 02/14/2015   Procedure: CYSTOSCOPY WITH RETROGRADE PYELOGRAM, URETEROSCOPY AND STENT PLACEMENT;  Surgeon: Alexis Frock, MD;  Location: [facility];  Service: Urology;  Laterality: Right;  . KIDNEY STONE SURGERY    . NO PAST SURGERIES    . STONE EXTRACTION WITH BASKET Right 02/14/2015   Procedure: STONE EXTRACTION WITH BASKET;  Surgeon: Alexis Frock, MD;  [facility];  Service: Urology;  Laterality: Right;    There were no vitals filed for this visit.  Subjective Assessment - 01/26/18 1510    Subjective  5/10 back pain currently, and at worst for the past 7 days. Pt adds that he has not done anything. Usually has pain when he does not do anything either.  The HEP is good. Able to do them daily.  Wants to work on his R ankle tendonitis.  Still feels tightness in his low back like a band when he bends over to reach for his shoes.     Pertinent History  Chronic low back pain. Back pain began about 12 years ago in 2007. 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 Could not move afterwards. Went to urgent care and was diagnosed with arthritis. Also has bilateral foot pain including his ankles. Has an appointment with podiatry. Trying to stay off his feet as much as possible. Also quit smoking but gained 10 lbs. Seems like everytime he quits, he gets 10 lbs. Also has an inversion table this helps his back. Currently has difficulty putting his socks on. Also goes to a chiropractor which helps out too.  Went to 1 PT appointment at [facility] about 3 years ago but had to stop  because it was too far.  Doing the exercise when he is on all fours and pushing up helped. Has a hard time bending over to put his socks and shoes on. Unable to hold his bowel at times when he needs to go. Happened about a week ago. His doctor does not know about that. Able to hold his bowels if it is not loose. Has accidents when his bowel is loose/diarrhea. Able to hold urine. Denies saddle anesthesia.     Patient Stated Goals  Goals: be better able to put his socks on.     Currently in Pain?  Yes    Pain Score  5     Pain Onset  More than a month ago         [facility] PT Assessment - 01/26/18 1519      Observation/Other Assessments   Modified Oswertry  42%   measured on 01/20/2018     Strength   Right Hip Extension  4-/5    Right Hip ABduction  4+/5     Left Hip Extension  4/5    Left Hip ABduction  4+/5                           PT Education - 01/26/18 1533    Education Details  ther-ex, HEP    Person(s) Educated  Patient    Methods  Explanation;Demonstration;Tactile cues;Verbal cues;Handout    Comprehension  Returned demonstration;Verbalized understanding         Objective    Therapeutic exercise  Blood pressure L arm sitting, mechanically taken, normal cuff: 137/91, HR 94  Reviewed plan of care: 2x/week for 5 weeks secondary to pt still feels like he has difficulty reaching his foot   Manually resisted hip extension, hip abduction 1-2x each way for each LE  Reviewed progress/current status with strength  standing hip extension 10x3 each LE red band with bilateral UE assist   BP L arm sitting, mechanically taken: 131/85, HR 84 after exercises  Standing bilateral scapular retractions 10x, then 8x5 seconds  with PT assist. Improved ability to perform after cues.   Then resisting yellow band 10x5 seconds for 2 sets  Reviewed HEP. Pt demonstrated and verbalized understanding.   Perform ergonomic lifting next visit if appropriate.    Improved exercise technique, movement at target joints, use of target muscles after mod verbal, visual, tactile cues.    Pt demonstrates overall decrease in back pain, improved function, and improved bilateral glute med muscle strength since initial evaluation. Pt is also compliant with his HEP. PT will benefit from continued skilled physical therapy services to promote ability to reach his shoes more comfortably, continue to decrease back pain, improve strength, and continue to improve ability to perform functional tasks.         PT Short Term Goals - 01/26/18 1633      PT SHORT TERM GOAL #1   Title  Patient will be independent with his HEP to promote ability to perform tasks such as picking up items from the floor or donning and doffing his socks and shoes  with less back pain.     Baseline  Pt has started a limited home exercise program. (12/29/2017); Pt compliant with his HEP. More home exercises periodically added (01/26/2018)    Time  3    Period  Weeks    Status  On-going    Target Date  01/21/18  PT Long Term Goals - 01/26/18 1636      PT LONG TERM GOAL #1   Title  Patient will have a decrease in back pain to 4/10 or less at worst to promote ability to perform tasks such as picking up items from the floor or donning and doffing his socks and shoes with less back pain.     Baseline  8/10 back pain at worst for the past month (12/29/2017); 5/10 back pain at worst for the past 7 days (01/26/2018)    Time  5    Period  Weeks    Status  Partially Met    Target Date  03/04/18      PT LONG TERM GOAL #2   Title  Patient will improve bilateral hip extension and abduction strength by at least 1/2 MMT grade to promote ability to perform standing tasks with less pain.     Baseline  Hip extension: 4-/5 R, 4/5 L, hip abduction: 4/5 R, 4/5 L (12/29/2017); hip extension 4-/5 R, 4/5 L, hip abduction 4+/5 bilaterally (01/26/2018)    Time  5    Period  Weeks    Status  Partially Met    Target Date  03/04/18      PT LONG TERM GOAL #3   Title  Patient will improve his Modified Oswestry Low Back Pain Disability questionnaire score by at least 10% as a demonstration of improved function.     Baseline  66% (12/29/2017); 42% (01/26/2018)    Time  4    Period  Weeks    Status  Achieved    Target Date  01/28/18      PT LONG TERM GOAL #4   Title  Pt will report less difficulty reaching his feet to promote ability to don and doff his socks and shoes.     Baseline  Pt reports difficulty reaching his feet secondary to low back tightness (01/26/2018)    Time  5    Period  Weeks    Status  New    Target Date  03/04/18            Plan - 01/26/18 1507    Clinical Impression Statement  Pt demonstrates overall decrease in back pain, improved function,  and improved bilateral glute med muscle strength since initial evaluation. Pt is also compliant with his HEP. PT will benefit from continued skilled physical therapy services to promote ability to reach his shoes more comfortably, continue to decrease back pain, improve strength, and continue to improve ability to perform functional tasks.     History and Personal Factors relevant to plan of care:  Chronicity of condition, back pain, difficulty performing tasks which involve lifting and bending over, multiple areas of pain (back, ankles)     Clinical Presentation  Stable    Clinical Presentation due to:  Pt demonstrates overall decreased back pain, improved function    Clinical Decision Making  Low    Rehab Potential  Fair    Clinical Impairments Affecting Rehab Potential  (-) chronicity of condition, multiple areas of pain; (+) motivted    PT Frequency  2x / week    PT Duration  Other (comment)   5 weeks   PT Treatment/Interventions  Therapeutic activities;Therapeutic exercise;Neuromuscular re-education;Patient/family education;Manual techniques;Dry needling;Aquatic Therapy;Electrical Stimulation;Iontophoresis 81m/ml Dexamethasone;Traction;Ultrasound;Gait training    PT Next Visit Plan  back extension, hip strengthening, ergonomics, manual techniques, modalities PRN    Consulted and Agree with Plan of Care  Patient  Patient will benefit from skilled therapeutic intervention in order to improve the following deficits and impairments:  Pain, Postural dysfunction, Improper body mechanics, Decreased strength, Difficulty walking, Decreased range of motion  Visit Diagnosis: Chronic low back pain, unspecified back pain laterality, with sciatica presence unspecified - Plan: PT plan of care cert/re-cert  Muscle weakness (generalized) - Plan: PT plan of care cert/re-cert  Difficulty in walking, not elsewhere classified - Plan: PT plan of care cert/re-cert     Problem List Patient Active  Problem List   Diagnosis Date Noted  . Chronic pain syndrome 01/18/2018  . Chronic pain of right ankle 01/18/2018  . OSA (obstructive sleep apnea) 12/20/2014  . Musculoskeletal back pain 09/08/2013  . Depression 09/08/2013  . HTN (hypertension) 09/08/2013    Joneen Boers PT, DPT   01/26/2018, 7:26 PM  [place] [facility] [address] [place], [address] Phone: [phone]   Fax:  [phone]  Name: KHYREE CARILLO MRN: [id number] Date of Birth: December 25, 1965

## 2018-01-26 NOTE — Telephone Encounter (Signed)
Left voicemail that PA were sent for Voltaren 75 mg and Lyrica 25 mg via [employer] and to please check with pharmacy to see if this has gone through

## 2018-01-26 NOTE — Telephone Encounter (Signed)
Called patient to ask which medications need PA. Left voicemail.

## 2018-01-29 ENCOUNTER — Telehealth: Payer: Self-pay | Admitting: Pharmacy Technician

## 2018-01-29 NOTE — Telephone Encounter (Signed)
Patient has Medicaid.  Patient acknowledged that he understood that [facility] would no longer be able to provide medication assistance.  Sherilyn DacostaBetty J. Zamari Bonsall Care Manager [facility]

## 2018-02-01 ENCOUNTER — Telehealth: Payer: Self-pay | Admitting: *Deleted

## 2018-02-01 NOTE — Telephone Encounter (Signed)
Let patient know that I resent PA for voltaren and Lyrica through [employer].  He will check back with his pharmacy tomorrow. Also gave him the impressions of xrays and that Dr Cherylann RatelLateef would go over these in more detail at his next visit.

## 2018-02-02 ENCOUNTER — Ambulatory Visit: Payer: Medicaid Other

## 2018-02-02 DIAGNOSIS — M545 Low back pain: Principal | ICD-10-CM

## 2018-02-02 DIAGNOSIS — R262 Difficulty in walking, not elsewhere classified: Secondary | ICD-10-CM

## 2018-02-02 DIAGNOSIS — G8929 Other chronic pain: Secondary | ICD-10-CM

## 2018-02-02 DIAGNOSIS — M6281 Muscle weakness (generalized): Secondary | ICD-10-CM

## 2018-02-02 NOTE — Therapy (Signed)
[place] [facility] [address], [place], [address] Phone: [phone]   Fax:  [phone]  Physical Therapy Treatment  Patient Details  Name: Jason [name] MRN: [id number] Date of Birth: [birth year] Referring Provider: Freddy Finner, FNP   Encounter Date: 02/02/2018  PT End of Session - 02/02/18 1524    Visit Number  4    Number of Visits  14    Date for PT Re-Evaluation  03/04/18    Authorization Type  1    Authorization Time Period  of 12 Medicaid    PT Start Time  1524   pt arrived late   PT Stop Time  1604    PT Time Calculation (min)  40 min    Activity Tolerance  Patient tolerated treatment well    Behavior During Therapy  [facility] for tasks assessed/performed       Past Medical History:  Diagnosis Date  . Anxiety   . Arthritis   . Chronic low back pain   . Chronic pain 2002  . Degenerative disc disease, lumbar   . Depression   . Diverticulosis of colon   . History of hiatal hernia   . Hypertension   . Incomplete right bundle branch block   . OSA on CPAP   . Right nephrolithiasis   . Urgency of urination   . Wears partial dentures    UPPER AND LOWER    Past Surgical History:  Procedure Laterality Date  . COLONOSCOPY W/ BIOPSIES AND POLYPECTOMY    . CYSTOSCOPY WITH HOLMIUM LASER LITHOTRIPSY Right 02/14/2015   Procedure: CYSTOSCOPY WITH HOLMIUM LASER LITHOTRIPSY;  Surgeon: Alexis Frock, MD;  Location: [facility];  Service: Urology;  Laterality: Right;  . CYSTOSCOPY WITH RETROGRADE PYELOGRAM, URETEROSCOPY AND STENT PLACEMENT Right 02/14/2015   Procedure: CYSTOSCOPY WITH RETROGRADE PYELOGRAM, URETEROSCOPY AND STENT PLACEMENT;  Surgeon: Alexis Frock, MD;  Location: [facility];  Service: Urology;  Laterality: Right;  . KIDNEY STONE SURGERY    . NO PAST SURGERIES    . STONE EXTRACTION WITH BASKET Right 02/14/2015   Procedure: STONE EXTRACTION WITH BASKET;  Surgeon:  Alexis Frock, MD;  Location: [facility];  Service: Urology;  Laterality: Right;    There were no vitals filed for this visit.  Subjective Assessment - 02/02/18 1526    Subjective  Pt states having a heel spur on his R foot. Back is doing pretty good. Not much pain. Probably a 4/10 currently.  Took his BP medication at 12 pm today.  Putting pressure on his R foot helps with the heel pain.     Pertinent History  Chronic low back pain. Back pain began about 12 years ago in 2007. 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 Could not move afterwards. Went to urgent care and was diagnosed with arthritis. Also has bilateral foot pain including his ankles. Has an appointment with podiatry. Trying to stay off his feet as much as possible. Also quit smoking but gained 10 lbs. Seems like everytime he quits, he gets 10 lbs. Also has an inversion table this helps his back. Currently has difficulty putting his socks on. Also goes to a chiropractor which helps out too.  Went to 1 PT appointment at [facility] about 3 years ago but had to stop because it was too far.  Doing the exercise when he is on all fours and pushing up helped. Has  a hard time bending over to put his socks and shoes on. Unable to hold his bowel at times when he needs to go. Happened about a week ago. His doctor does not know about that. Able to hold his bowels if it is not loose. Has accidents when his bowel is loose/diarrhea. Able to hold urine. Denies saddle anesthesia.     Patient Stated Goals  Goals: be better able to put his socks on.     Currently in Pain?  Yes    Pain Score  4    back   Pain Onset  More than a month ago                               PT Education - 02/02/18 1528    Education Details  ther-ex    Person(s) Educated  Patient    Methods  Explanation;Demonstration;Tactile cues;Verbal cues    Comprehension  Returned demonstration;Verbalized understanding       Objective   Pt states putting pressure on his R foot helps decrease R heel pain.  Therapeutic exercise  Sitting with lumbar towel roll  Blood pressure L arm sitting, mechanically taken, normal cuff: 159/92, HR 68  Standing back extension 10x5 seconds. Pt states exercise takes pressure off his back  Then with R bias 10x5 seconds   Then with L bias 10x5 seconds   Ergonomic lifting like activies  Standing mini squats with emphasis on hip and knee flexion and straight back 5x2   Standing bilateral scapular retractions 10x5 seconds for 3 sets to promote thoracic extension   Difficulty with scapular retraction at first but improved with practice and mod tactile and verbal cues  Seated thoracic extension over chair 10x5 seconds for 3 sets   Reviewed and given as part of his HEP. Pt demonstrated and verbalized understanding. Handout provided.   Seated R ankle DF/PF to help with heel pain for 2 minutes  Standing R hip abduction 10x5 seconds for 2 sets with bilateral UE assist   Standing L hip abduction with bilateral UE assist 10x5 seconds. No R heel pain.     Improved exercise technique, movement at target joints, use of target muscles after min to mod verbal, visual, tactile cues.    Decreased back pain with trunk extension related exercises. Pt states feeling better after session. Pt will benefit from continued skilled PT services to decrease pain and improve function.     PT Short Term Goals - 01/26/18 1633      PT SHORT TERM GOAL #1   Title  Patient will be independent with his HEP to promote ability to perform tasks such as picking up items from the floor or donning and doffing his socks and shoes with less back pain.     Baseline  Pt has started a limited home exercise program. (12/29/2017); Pt compliant with his HEP. More home exercises periodically added (01/26/2018)    Time  3    Period  Weeks    Status  On-going    Target Date  01/21/18        PT Long  Term Goals - 01/26/18 1636      PT LONG TERM GOAL #1   Title  Patient will have a decrease in back pain to 4/10 or less at worst to promote ability to perform tasks such as picking up items from the floor or donning and doffing his socks and shoes with  less back pain.     Baseline  8/10 back pain at worst for the past month (12/29/2017); 5/10 back pain at worst for the past 7 days (01/26/2018)    Time  5    Period  Weeks    Status  Partially Met    Target Date  03/04/18      PT LONG TERM GOAL #2   Title  Patient will improve bilateral hip extension and abduction strength by at least 1/2 MMT grade to promote ability to perform standing tasks with less pain.     Baseline  Hip extension: 4-/5 R, 4/5 L, hip abduction: 4/5 R, 4/5 L (12/29/2017); hip extension 4-/5 R, 4/5 L, hip abduction 4+/5 bilaterally (01/26/2018)    Time  5    Period  Weeks    Status  Partially Met    Target Date  03/04/18      PT LONG TERM GOAL #3   Title  Patient will improve his Modified Oswestry Low Back Pain Disability questionnaire score by at least 10% as a demonstration of improved function.     Baseline  66% (12/29/2017); 42% (01/26/2018)    Time  4    Period  Weeks    Status  Achieved    Target Date  01/28/18      PT LONG TERM GOAL #4   Title  Pt will report less difficulty reaching his feet to promote ability to don and doff his socks and shoes.     Baseline  Pt reports difficulty reaching his feet secondary to low back tightness (01/26/2018)    Time  5    Period  Weeks    Status  New    Target Date  03/04/18            Plan - 02/02/18 1521    Clinical Impression Statement  Decreased back pain with trunk extension related exercises. Pt states feeling better after session. Pt will benefit from continued skilled PT services to decrease pain and improve function.     Rehab Potential  Fair    Clinical Impairments Affecting Rehab Potential  (-) chronicity of condition, multiple areas of pain; (+) motivted     PT Frequency  2x / week    PT Duration  Other (comment)   5 weeks   PT Treatment/Interventions  Therapeutic activities;Therapeutic exercise;Neuromuscular re-education;Patient/family education;Manual techniques;Dry needling;Aquatic Therapy;Electrical Stimulation;Iontophoresis 51m/ml Dexamethasone;Traction;Ultrasound;Gait training    PT Next Visit Plan  back extension, hip strengthening, ergonomics, manual techniques, modalities PRN    Consulted and Agree with Plan of Care  Patient       Patient will benefit from skilled therapeutic intervention in order to improve the following deficits and impairments:  Pain, Postural dysfunction, Improper body mechanics, Decreased strength, Difficulty walking, Decreased range of motion  Visit Diagnosis: Chronic low back pain, unspecified back pain laterality, with sciatica presence unspecified  Muscle weakness (generalized)  Difficulty in walking, not elsewhere classified     Problem List Patient Active Problem List   Diagnosis Date Noted  . Chronic pain syndrome 01/18/2018  . Chronic pain of right ankle 01/18/2018  . OSA (obstructive sleep apnea) 12/20/2014  . Musculoskeletal back pain 09/08/2013  . Depression 09/08/2013  . HTN (hypertension) 09/08/2013   MJoneen BoersPT, DPT  02/02/2018, 8:28 PM  [facility] [address] [place] [address]Phone: [phone]  Fax:  [phone] Name: Jason STEINHARTMRN: [id number]  of Birth: [birth year]

## 2018-02-02 NOTE — Patient Instructions (Signed)
(  Home) Extension: Thoracic With Lumbar Lock - Sitting    Sit with back against chair, knees bent, hands folded across (not shown). Extend trunk over chair back. Hold position for __5__ seconds. Repeat ___10  times per set. Do _3___ sets per session daily.   Copyright  VHI. All rights reserved.     

## 2018-02-04 ENCOUNTER — Ambulatory Visit: Payer: Medicaid Other

## 2018-02-10 ENCOUNTER — Ambulatory Visit: Payer: Medicaid Other

## 2018-02-17 ENCOUNTER — Ambulatory Visit: Payer: Medicaid Other | Attending: Primary Care

## 2018-02-17 DIAGNOSIS — M545 Low back pain: Secondary | ICD-10-CM | POA: Insufficient documentation

## 2018-02-17 DIAGNOSIS — R262 Difficulty in walking, not elsewhere classified: Secondary | ICD-10-CM | POA: Insufficient documentation

## 2018-02-17 DIAGNOSIS — G8929 Other chronic pain: Secondary | ICD-10-CM | POA: Insufficient documentation

## 2018-02-17 DIAGNOSIS — M6281 Muscle weakness (generalized): Secondary | ICD-10-CM | POA: Insufficient documentation

## 2018-02-22 ENCOUNTER — Encounter: Payer: Medicaid Other | Admitting: Student in an Organized Health Care Education/Training Program

## 2018-02-22 ENCOUNTER — Ambulatory Visit: Payer: Medicaid Other

## 2018-02-24 ENCOUNTER — Telehealth: Payer: Self-pay

## 2018-02-24 ENCOUNTER — Ambulatory Visit: Payer: Medicaid Other

## 2018-02-24 NOTE — Telephone Encounter (Signed)
No show. Called patient and left a message pertaining to today's and previously missed appointment. Return phone call requested. Phone number [phone])

## 2018-02-25 ENCOUNTER — Ambulatory Visit: Payer: Medicaid Other

## 2018-02-25 DIAGNOSIS — G8929 Other chronic pain: Secondary | ICD-10-CM

## 2018-02-25 DIAGNOSIS — R262 Difficulty in walking, not elsewhere classified: Secondary | ICD-10-CM

## 2018-02-25 DIAGNOSIS — M6281 Muscle weakness (generalized): Secondary | ICD-10-CM

## 2018-02-25 DIAGNOSIS — M545 Low back pain: Secondary | ICD-10-CM | POA: Diagnosis present

## 2018-02-25 NOTE — Therapy (Signed)
[place] [facility] [address], [place], [address] Phone: [phone]   Fax:  [phone]  Physical Therapy Treatment  Patient Details  Name: Jason [name] MRN: [id number] Date of Birth: 1965-10-22 Referring Provider: Freddy Finner, FNP   Encounter Date: 02/25/2018  PT End of Session - 02/25/18 1554    Visit Number  5    Number of Visits  14    Date for PT Re-Evaluation  03/04/18    Authorization Type  2    Authorization Time Period  of 12 Medicaid    PT Start Time  3762    PT Stop Time  8315    PT Time Calculation (min)  47 min    Activity Tolerance  Patient tolerated treatment well    Behavior During Therapy  [facility] for tasks assessed/performed       Past Medical History:  Diagnosis Date  . Anxiety   . Arthritis   . Chronic low back pain   . Chronic pain 2002  . Degenerative disc disease, lumbar   . Depression   . Diverticulosis of colon   . History of hiatal hernia   . Hypertension   . Incomplete right bundle branch block   . OSA on CPAP   . Right nephrolithiasis   . Urgency of urination   . Wears partial dentures    UPPER AND LOWER    Past Surgical History:  Procedure Laterality Date  . COLONOSCOPY W/ BIOPSIES AND POLYPECTOMY    . CYSTOSCOPY WITH HOLMIUM LASER LITHOTRIPSY Right 02/14/2015   Procedure: CYSTOSCOPY WITH HOLMIUM LASER LITHOTRIPSY;  Surgeon: Alexis Frock, MD;  Location: [facility];  Service: Urology;  Laterality: Right;  . CYSTOSCOPY WITH RETROGRADE PYELOGRAM, URETEROSCOPY AND STENT PLACEMENT Right 02/14/2015   Procedure: CYSTOSCOPY WITH RETROGRADE PYELOGRAM, URETEROSCOPY AND STENT PLACEMENT;  Surgeon: Alexis Frock, MD;  Location: [facility];  Service: Urology;  Laterality: Right;  . KIDNEY STONE SURGERY    . NO PAST SURGERIES    . STONE EXTRACTION WITH BASKET Right 02/14/2015   Procedure: STONE EXTRACTION WITH BASKET;  Surgeon: Alexis Frock, MD;   Location: [facility];  Service: Urology;  Laterality: Right;    There were no vitals filed for this visit.  Subjective Assessment - 02/25/18 1558    Subjective  Pt states that his R heel pain is Achilles tendonitis. Back is doing pretty darn good. Does not know if the foot pain is distracting him from the back.  No back pain currently but limited mobilty. Putting his sock and shoe on is getting a little easier.  4/10 back pain at most for the past 7 days. Has been doing his HEP.  Has also not been doing work around the house which gave his back a break. Going to the beach this Sunday. Coming back from the beach next Thursday.     Pertinent History  Chronic low back pain. Back pain began about 12 years ago in 2007. 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 Could not move afterwards. Went to urgent care and was diagnosed with arthritis. Also has bilateral foot pain including his ankles. Has an appointment with podiatry. Trying to stay off his feet as much as possible. Also quit smoking but gained 10 lbs. Seems like everytime he quits, he gets 10 lbs. Also has an inversion table this helps his back. Currently has difficulty putting his socks on.  Also goes to a chiropractor which helps out too.  Went to 1 PT appointment at [facility] about 3 years ago but had to stop because it was too far.  Doing the exercise when he is on all fours and pushing up helped. Has a hard time bending over to put his socks and shoes on. Unable to hold his bowel at times when he needs to go. Happened about a week ago. His doctor does not know about that. Able to hold his bowels if it is not loose. Has accidents when his bowel is loose/diarrhea. Able to hold urine. Denies saddle anesthesia.     Patient Stated Goals  Goals: be better able to put his socks on.     Currently in Pain?  No/denies    Pain Score  0-No pain    Pain Onset  More than a month ago                                PT Education - 02/25/18 1610    Education Details  ther-ex, HEP    Person(s) Educated  Patient    Methods  Explanation;Demonstration;Tactile cues;Verbal cues    Comprehension  Returned demonstration;Verbalized understanding         Objective   Pt states putting pressure on his R foot helps decrease R heel pain.  Therapeutic exercise   Blood pressure L arm sitting, mechanically taken, normal cuff: 147/100, HR79. Pt states no lightheadedness or dizziness.   Seated trunk extension on chair with lumbar towel roll 10x5 seconds for 3 sets  Pt was recommended that if his blood pressure is very high, such as 180/100, don't do HEP. Pt verbalized understanding.   Standing back extension 10x5 seconds.             Then with R bias 10x5 seconds              Then with L bias 10x5 seconds   Standing bilateral scapular retractions 10x5 seconds for 3 sets to promote thoracic extension   BP L arm sitting, mechanically taken, normal cuff: 141/90, HR 75  Standing bilateral scapular retraction with glute max squeeze 10x5 seconds   Standing forward weight shifting onto forefeet 10x5 seconds for 2 sets to help with heel pain for standing exercises for back. Decreased R achilles pain, but felt anterior ankle discomfort.   Improved exercise technique, movement at target joints, use of target muscles after min to mod verbal, visual, tactile cues.     Contniued working on gentle exercises (secondary to elevated diastolic levels) to promote thoracic and low back extension. Also added isometric gastroc strengthening to promote proper stress to Achilles tendon. Decreased Achilles tendon pain after exercise. No complain of increased back pain throughout session. Pt making overall very good progress towards goals for decreased back pain with worst pain level being 4/10 for the past 7 days.  Patient will benefit from continued skilled physical  therapy services to continue progress with back pain and improve function.     PT Short Term Goals - 01/26/18 1633      PT SHORT TERM GOAL #1   Title  Patient will be independent with his HEP to promote ability to perform tasks such as picking up items from the floor or donning and doffing his socks and shoes with less back pain.     Baseline  Pt has started a limited home exercise program. (12/29/2017);  Pt compliant with his HEP. More home exercises periodically added (01/26/2018)    Time  3    Period  Weeks    Status  On-going    Target Date  01/21/18        PT Long Term Goals - 01/26/18 1636      PT LONG TERM GOAL #1   Title  Patient will have a decrease in back pain to 4/10 or less at worst to promote ability to perform tasks such as picking up items from the floor or donning and doffing his socks and shoes with less back pain.     Baseline  8/10 back pain at worst for the past month (12/29/2017); 5/10 back pain at worst for the past 7 days (01/26/2018)    Time  5    Period  Weeks    Status  Partially Met    Target Date  03/04/18      PT LONG TERM GOAL #2   Title  Patient will improve bilateral hip extension and abduction strength by at least 1/2 MMT grade to promote ability to perform standing tasks with less pain.     Baseline  Hip extension: 4-/5 R, 4/5 L, hip abduction: 4/5 R, 4/5 L (12/29/2017); hip extension 4-/5 R, 4/5 L, hip abduction 4+/5 bilaterally (01/26/2018)    Time  5    Period  Weeks    Status  Partially Met    Target Date  03/04/18      PT LONG TERM GOAL #3   Title  Patient will improve his Modified Oswestry Low Back Pain Disability questionnaire score by at least 10% as a demonstration of improved function.     Baseline  66% (12/29/2017); 42% (01/26/2018)    Time  4    Period  Weeks    Status  Achieved    Target Date  01/28/18      PT LONG TERM GOAL #4   Title  Pt will report less difficulty reaching his feet to promote ability to don and doff his socks and  shoes.     Baseline  Pt reports difficulty reaching his feet secondary to low back tightness (01/26/2018)    Time  5    Period  Weeks    Status  New    Target Date  03/04/18            Plan - 02/25/18 1611    Clinical Impression Statement  Contniued working on gentle exercises (secondary to elevated diastolic levels) to promote thoracic and low back extension. Also added isometric gastroc strengthening to promote proper stress to Achilles tendon. Decreased Achilles tendon pain after exercise. No complain of increased back pain throughout session. Pt making overall very good progress towards goals for decreased back pain with worst pain level being 4/10 for the past 7 days.  Patient will benefit from continued skilled physical therapy services to continue progress with back pain and improve function.     Rehab Potential  Fair    Clinical Impairments Affecting Rehab Potential  (-) chronicity of condition, multiple areas of pain; (+) motivted    PT Frequency  2x / week    PT Duration  Other (comment)   5 weeks   PT Treatment/Interventions  Therapeutic activities;Therapeutic exercise;Neuromuscular re-education;Patient/family education;Manual techniques;Dry needling;Aquatic Therapy;Electrical Stimulation;Iontophoresis 65m/ml Dexamethasone;Traction;Ultrasound;Gait training    PT Next Visit Plan  back extension, hip strengthening, ergonomics, manual techniques, modalities PRN    Consulted and Agree with Plan of Care  Patient       Patient will benefit from skilled therapeutic intervention in order to improve the following deficits and impairments:  Pain, Postural dysfunction, Improper body mechanics, Decreased strength, Difficulty walking, Decreased range of motion  Visit Diagnosis: Chronic low back pain, unspecified back pain laterality, with sciatica presence unspecified  Muscle weakness (generalized)  Difficulty in walking, not elsewhere classified     Problem List Patient Active  Problem List   Diagnosis Date Noted  . Chronic pain syndrome 01/18/2018  . Chronic pain of right ankle 01/18/2018  . OSA (obstructive sleep apnea) 12/20/2014  . Musculoskeletal back pain 09/08/2013  . Depression 09/08/2013  . HTN (hypertension) 09/08/2013    Joneen Boers PT, DPT   02/25/2018, 5:02 PM  [place] [facility] [address], [place], [address] Phone: [phone]   Fax:  [phone]  Name: Jason [name] MRN: [id number] Date of Birth: [birth year]

## 2018-03-01 ENCOUNTER — Ambulatory Visit: Payer: Medicaid Other

## 2018-03-03 ENCOUNTER — Ambulatory Visit: Payer: Medicaid Other

## 2018-03-08 ENCOUNTER — Ambulatory Visit: Payer: Medicaid Other

## 2018-03-09 ENCOUNTER — Ambulatory Visit
Payer: Medicaid Other | Attending: Student in an Organized Health Care Education/Training Program | Admitting: Student in an Organized Health Care Education/Training Program

## 2018-03-09 ENCOUNTER — Encounter: Payer: Self-pay | Admitting: Student in an Organized Health Care Education/Training Program

## 2018-03-09 ENCOUNTER — Other Ambulatory Visit: Payer: Self-pay

## 2018-03-09 VITALS — BP 144/95 | HR 97 | Temp 98.6°F | Resp 18 | Ht 68.0 in | Wt 230.0 lb

## 2018-03-09 DIAGNOSIS — G4733 Obstructive sleep apnea (adult) (pediatric): Secondary | ICD-10-CM | POA: Insufficient documentation

## 2018-03-09 DIAGNOSIS — M47816 Spondylosis without myelopathy or radiculopathy, lumbar region: Secondary | ICD-10-CM | POA: Diagnosis not present

## 2018-03-09 DIAGNOSIS — I10 Essential (primary) hypertension: Secondary | ICD-10-CM | POA: Diagnosis not present

## 2018-03-09 DIAGNOSIS — F419 Anxiety disorder, unspecified: Secondary | ICD-10-CM | POA: Insufficient documentation

## 2018-03-09 DIAGNOSIS — Z87891 Personal history of nicotine dependence: Secondary | ICD-10-CM | POA: Insufficient documentation

## 2018-03-09 DIAGNOSIS — Z79899 Other long term (current) drug therapy: Secondary | ICD-10-CM | POA: Diagnosis not present

## 2018-03-09 DIAGNOSIS — G894 Chronic pain syndrome: Secondary | ICD-10-CM | POA: Diagnosis present

## 2018-03-09 DIAGNOSIS — M47818 Spondylosis without myelopathy or radiculopathy, sacral and sacrococcygeal region: Secondary | ICD-10-CM | POA: Diagnosis not present

## 2018-03-09 DIAGNOSIS — K449 Diaphragmatic hernia without obstruction or gangrene: Secondary | ICD-10-CM | POA: Insufficient documentation

## 2018-03-09 DIAGNOSIS — F329 Major depressive disorder, single episode, unspecified: Secondary | ICD-10-CM | POA: Diagnosis not present

## 2018-03-09 DIAGNOSIS — M19071 Primary osteoarthritis, right ankle and foot: Secondary | ICD-10-CM | POA: Diagnosis not present

## 2018-03-09 DIAGNOSIS — M461 Sacroiliitis, not elsewhere classified: Secondary | ICD-10-CM

## 2018-03-09 MED ORDER — PREGABALIN 25 MG PO CAPS: 25.0000 mg | ORAL_CAPSULE | Freq: Three times a day (TID) | ORAL | 3 refills | Status: DC

## 2018-03-09 MED ORDER — DICLOFENAC SODIUM 75 MG PO TBEC: 75.0000 mg | DELAYED_RELEASE_TABLET | Freq: Two times a day (BID) | ORAL | 0 refills | Status: DC

## 2018-03-09 NOTE — Progress Notes (Signed)
Safety precautions to be maintained throughout the outpatient stay will include: orient to surroundings, keep bed in low position, maintain call bell within reach at all times, provide assistance with transfer out of bed and ambulation.  

## 2018-03-09 NOTE — Patient Instructions (Signed)
Prescriptions for Lyrica and Voltaren were sent to your pharmacy. ____________________________________________________________________________________________  Preparing for Procedure with Sedation  Instructions: . Oral Intake: Do not eat or drink anything for at least 8 hours prior to your procedure. . Transportation: Public transportation is not allowed. Bring an adult driver. The driver must be physically present in our waiting room before any procedure can be started. Marland Kitchen Physical Assistance: Bring an adult physically capable of assisting you, in the event you need help. This adult should keep you company at home for at least 6 hours after the procedure. . Blood Pressure Medicine: Take your blood pressure medicine with a sip of water the morning of the procedure. . Blood thinners: Notify our staff if you are taking any blood thinners. Depending on which one you take, there will be specific instructions on how and when to stop it. . Diabetics on insulin: Notify the staff so that you can be scheduled 1st case in the morning. If your diabetes requires high dose insulin, take only  of your normal insulin dose the morning of the procedure and notify the staff that you have done so. . Preventing infections: Shower with an antibacterial soap the morning of your procedure. . Build-up your immune system: Take 1000 mg of Vitamin C with every meal (3 times a day) the day prior to your procedure. Marland Kitchen Antibiotics: Inform the staff if you have a condition or reason that requires you to take antibiotics before dental procedures. . Pregnancy: If you are pregnant, call and cancel the procedure. . Sickness: If you have a cold, fever, or any active infections, call and cancel the procedure. . Arrival: You must be in the facility at least 30 minutes prior to your scheduled procedure. . Children: Do not bring children with you. . Dress appropriately: Bring dark clothing that you would not mind if they get  stained. . Valuables: Do not bring any jewelry or valuables.  Procedure appointments are reserved for interventional treatments only. Marland Kitchen No Prescription Refills. . No medication changes will be discussed during procedure appointments. . No disability issues will be discussed.  Reasons to call and reschedule or cancel your procedure: (Following these recommendations will minimize the risk of a serious complication.) . Surgeries: Avoid having procedures within 2 weeks of any surgery. (Avoid for 2 weeks before or after any surgery). . Flu Shots: Avoid having procedures within 2 weeks of a flu shots or . (Avoid for 2 weeks before or after immunizations). . Barium: Avoid having a procedure within 7-10 days after having had a radiological study involving the use of radiological contrast. (Myelograms, Barium swallow or enema study). . Heart attacks: Avoid any elective procedures or surgeries for the initial 6 months after a "Myocardial Infarction" (Heart Attack). . Blood thinners: It is imperative that you stop these medications before procedures. Let us know if you if you take any blood thinner.  . Infection: Avoid procedures during or within two weeks of an infection (including chest colds or gastrointestinal problems). Symptoms associated with infections include: Localized redness, fever, chills, night sweats or profuse sweating, burning sensation when voiding, cough, congestion, stuffiness, runny nose, sore throat, diarrhea, nausea, vomiting, cold or Flu symptoms, recent or current infections. It is specially important if the infection is over the area that we intend to treat. Marland Kitchen Heart and lung problems: Symptoms that may suggest an active cardiopulmonary problem include: cough, chest pain, breathing difficulties or shortness of breath, dizziness, ankle swelling, uncontrolled high or unusually low blood pressure, and/or  palpitations. If you are experiencing any of these symptoms, cancel your procedure and  contact your primary care physician for an evaluation.  Remember:  Regular Business hours are:  Monday to Thursday 8:00 AM to 4:00 PM  Provider's Schedule: Milinda Pointer, MD:  Procedure days: Tuesday and Thursday 7:30 AM to 4:00 PM  Gillis Santa, MD:  Procedure days: Monday and Wednesday 7:30 AM to 4:00 PM ____________________________________________________________________________________________

## 2018-03-09 NOTE — Progress Notes (Signed)
Patient's Name: Jason [name]  MRN: [id number]  Referring Provider: Freddy Finner, NP  DOB: 10-09-1965  PCP: Freddy Finner, NP  DOS: 03/09/2018  Note by: Gillis Santa, MD  Service setting: Ambulatory outpatient  Specialty: Interventional Pain Management  Location: [facility] (AMB) Pain Management Facility    Patient type: Established   Primary Reason(s) for Visit: Evaluation of chronic illnesses with exacerbation, or progression (Level of risk: moderate) CC: Ankle Pain (right)  HPI  Mr. Mcgath is a 52 y.o. year old, male patient, who comes today for a follow-up evaluation. He has Musculoskeletal back pain; Depression; OSA (obstructive sleep apnea); HTN (hypertension); Chronic pain syndrome; and Chronic pain of right ankle on their problem list. Mr. Wafer was last seen on 02/22/2018. His primarily concern today is the Ankle Pain (right)  Pain Assessment: Location: Right Ankle Radiating: denies Onset: More than a month ago Duration: Chronic pain Quality: Shooting Severity: 6 /10 (subjective, self-reported pain score)  Note: Reported level is inconsistent with clinical observations.                         When using our objective Pain Scale, levels between 6 and 10/10 are said to belong in an emergency room, as it progressively worsens from a 6/10, described as severely limiting, requiring emergency care not usually available at an outpatient pain management facility. At a 6/10 level, communication becomes difficult and requires great effort. Assistance to reach the emergency department may be required. Facial flushing and profuse sweating along with potentially dangerous increases in heart rate and blood pressure will be evident. Effect on ADL:   Timing: Intermittent Modifying factors: rest BP: (!) 144/95  HR: 97  Further details on both, my assessment(s), as well as the proposed treatment plan, please see below.  Patient follows up today to review his lumbar spine, SI joints, right ankle x-ray.   Patient's lumbar spine x-ray shows lumbar spondylosis and facet arthropathy at L3, L4, L5.  Patient also has mild degenerative changes involving his bilateral sacroiliac joints details of which are below.  Patient's right ankle x-ray was largely unremarkable although he is continuing to endorse significant right ankle pain.  He was evaluated by orthopedics recently and is scheduled to have an MRI of his right ankle to evaluate for any ligamentous injury.  In regards to medication management, patient is tolerating Lyrica 25 mg 3 times a day.  He does not want to increase the dose.  He was unable to pick up the diclofenac.  This will be represcribed.  Note patient will not be a candidate for chronic opioid therapy given a history of nasal abscess for snorting Percocet.  Laboratory Chemistry  Inflammation Markers (CRP: Acute Phase) (ESR: Chronic Phase) No results found for: CRP, ESRSEDRATE, LATICACIDVEN                       Rheumatology Markers No results found for: RF, ANA, LABURIC, URICUR, LYMEIGGIGMAB, LYMEABIGMQN, HLAB27                      Renal Function Markers Lab Results  Component Value Date   BUN 20 02/20/2015   CREATININE 1.12 02/20/2015   GFRAA >60 02/20/2015   GFRNONAA >60 02/20/2015                             Hepatic Function Markers Lab Results  Component Value  Date   AST 44 (H) 02/20/2015   ALT 64 (H) 02/20/2015   ALBUMIN 4.2 02/20/2015   ALKPHOS 87 02/20/2015   LIPASE 15 (L) 02/20/2015                        Electrolytes Lab Results  Component Value Date   NA 132 (L) 02/20/2015   K 3.8 02/20/2015   CL 94 (L) 02/20/2015   CALCIUM 9.4 02/20/2015                        Neuropathy Markers No results found for: VITAMINB12, FOLATE, HGBA1C, HIV                      CNS Tests No results found for: COLORCSF, APPEARCSF, RBCCOUNTCSF, WBCCSF, POLYSCSF, LYMPHSCSF, EOSCSF, PROTEINCSF, GLUCCSF, JCVIRUS, CSFOLI, IGGCSF                      Bone Pathology Markers No  results found for: [place], [id number], [id number], [id number], 25OHVITD1, 25OHVITD2, 25OHVITD3, TESTOFREE, TESTOSTERONE                       Coagulation Parameters Lab Results  Component Value Date   PLT 330 02/20/2015                        Cardiovascular Markers Lab Results  Component Value Date   TROPONINI <0.03 02/05/2015   HGB 18.0 02/20/2015   HCT 52.3 (H) 02/20/2015                         CA Markers No results found for: CEA, CA125, LABCA2                      Note: Lab results reviewed.  Recent Diagnostic Imaging Review   Lumbar DG Bending views:  Results for orders placed during the hospital encounter of 01/18/18  DG Lumbar Spine Complete W/Bend   Narrative CLINICAL DATA:  Chronic 10 year history of low back pain radiating into both LOWER extremities after a lifting injury at that time. MEDIAL RIGHT ankle pain over the past 1 month without injury.  EXAM: LUMBAR SPINE - COMPLETE WITH BENDING VIEWS  COMPARISON:  Bone window images from CT abdomen and pelvis 02/20/2015.  FINDINGS: Five non-rib-bearing lumbar vertebrae are present. T12 has very small, rudimentary ribs, confirmed on a prior chest x-ray 02/05/2015.  Anatomic posterior alignment. Slight lumbar levoscoliosis. No fractures. Disc space narrowing at every lumbar level, greatest at L3-4, progressive since 2016. No pars defects. Facet degenerative changes at L3-4, L4-5 and L5-S1.  Flexion and extension views demonstrate limited range of motion but no evidence of instability.  IMPRESSION: 1. Multilevel degenerative disc disease, spondylosis and facet degenerative changes as detailed above. 2. Limited range of motion but no evidence of instability on flexion and extension.   Electronically Signed   By: Evangeline Dakin M.D.   On: 01/18/2018 15:38      Sacroiliac Joint Imaging: Sacroiliac Joint DG:  Results for orders placed during the hospital encounter of 01/18/18  DG Si Joints    Narrative CLINICAL DATA:  Chronic 10 year history of low back pain radiating into both LOWER extremities after a lifting injury at that time. MEDIAL RIGHT ankle pain over the past 1 month without injury. Bone window images from CT  abdomen and pelvis 02/20/2015.  EXAM: BILATERAL SACROILIAC JOINTS - 3+ VIEW  COMPARISON:  Bone no images from CT abdomen and pelvis 02/20/2015.  FINDINGS: Sacroiliac joints intact with mild degenerative changes, similar to the prior CT. Symphysis pubis intact.  IMPRESSION: Mild degenerative changes involving the sacroiliac joints.   Electronically Signed   By: Evangeline Dakin M.D.   On: 01/18/2018 15:40     Ankle Imaging: Ankle-R DG Complete:  Results for orders placed during the hospital encounter of 01/18/18  DG Ankle Complete Right   Narrative CLINICAL DATA:  Chronic 10 year history of low back pain radiating into both LOWER extremities after a lifting injury at that time. MEDIAL RIGHT ankle pain over the past 1 month without injury.  EXAM: RIGHT ANKLE - COMPLETE 3+ VIEW  COMPARISON:  None.  FINDINGS: No evidence of acute, subacute or healed fractures. Ankle mortise intact with well-preserved joint space. Mild spurring involving the distal tibia posteriorly. Well-preserved bone mineral density. No visible joint effusion. Small plantar calcaneal spur.  IMPRESSION: Minimal degenerative changes.   Electronically Signed   By: Evangeline Dakin M.D.   On: 01/18/2018 15:41    Complexity Note: Imaging results reviewed. Results shared with Mr. Okane, using Layman's terms.                   I have personally examined the images and I agree with the reported  findings. I find no additional pain-related pathology to add to the report.  Meds   Current Outpatient Medications:  .  albuterol (PROVENTIL HFA;VENTOLIN HFA) 108 (90 Base) MCG/ACT inhaler, Inhale 2 puffs into the lungs every 4 (four) hours as needed for wheezing or shortness of  breath (4-6 hours PRN)., Disp: , Rfl:  .  buPROPion (ZYBAN) 150 MG 12 hr tablet, Take 150 mg by mouth 2 (two) times daily., Disp: , Rfl:  .  citalopram (CELEXA) 40 MG tablet, Take 40 mg by mouth daily. , Disp: , Rfl:  .  fluticasone (FLONASE) 50 MCG/ACT nasal spray, Place 1 spray into both nostrils daily., Disp: , Rfl:  .  hydrochlorothiazide (HYDRODIURIL) 25 MG tablet, Take 25 mg by mouth daily., Disp: , Rfl:  .  lisinopril (PRINIVIL,ZESTRIL) 20 MG tablet, Take 20 mg by mouth every morning., Disp: , Rfl:  .  metoprolol succinate (TOPROL-XL) 25 MG 24 hr tablet, Take 25 mg by mouth 2 (two) times daily. , Disp: , Rfl:  .  pregabalin (LYRICA) 25 MG capsule, Take 1 capsule (25 mg total) by mouth 3 (three) times daily., Disp: 90 capsule, Rfl: 3 .  sildenafil (VIAGRA) 50 MG tablet, Take 50 mg by mouth daily as needed for erectile dysfunction., Disp: , Rfl:  .  diclofenac (VOLTAREN) 75 MG EC tablet, Take 1 tablet (75 mg total) by mouth 2 (two) times daily., Disp: 60 tablet, Rfl: 0 .  tadalafil (CIALIS) 5 MG tablet, Take 5 mg by mouth daily as needed for erectile dysfunction., Disp: , Rfl:   ROS  Constitutional: Denies any fever or chills Gastrointestinal: No reported hemesis, hematochezia, vomiting, or acute GI distress Musculoskeletal: Denies any acute onset joint swelling, redness, loss of ROM, or weakness Neurological: No reported episodes of acute onset apraxia, aphasia, dysarthria, agnosia, amnesia, paralysis, loss of coordination, or loss of consciousness  Allergies  Mr. Fitzmaurice has No Known Allergies.  [place]  Drug: Mr. Jupin  reports that he has current or past drug history. Drug: Marijuana. Alcohol:  reports that he does not drink alcohol.  Tobacco:  reports that he has quit smoking. He smoked 0.00 packs per day. He has never used smokeless tobacco. Medical:  has a past medical history of Anxiety, Arthritis, Chronic low back pain, Chronic pain (2002), Degenerative disc disease, lumbar,  Depression, Diverticulosis of colon, History of hiatal hernia, Hypertension, Incomplete right bundle branch block, OSA on CPAP, Right nephrolithiasis, Urgency of urination, and Wears partial dentures. Surgical: Mr. Vandegrift  has a past surgical history that includes No past surgeries; Cystoscopy with retrograde pyelogram, ureteroscopy and stent placement (Right, 02/14/2015); Cystoscopy with holmium laser lithotripsy (Right, 02/14/2015); Stone extraction with basket (Right, 02/14/2015); Kidney stone surgery; and Colonoscopy w/ biopsies and polypectomy. Family: family history includes Cancer in his mother; Dementia in his father; Hypertension in his father and mother.  Constitutional Exam  General appearance: Well nourished, well developed, and well hydrated. In no apparent acute distress Vitals:   03/09/18 1046  BP: (!) 144/95  Pulse: 97  Resp: 18  Temp: 98.6 F (37 C)  TempSrc: Oral  SpO2: 95%  Weight: 230 lb (104.3 kg)  Height: 5' 8"  (1.727 m)   BMI Assessment: Estimated body mass index is 34.97 kg/m as calculated from the following:   Height as of this encounter: 5' 8"  (1.727 m).   Weight as of this encounter: 230 lb (104.3 kg).  BMI interpretation table: BMI level Category Range association with higher incidence of chronic pain  <18 kg/m2 Underweight   18.5-24.9 kg/m2 Ideal body weight   25-29.9 kg/m2 Overweight Increased incidence by 20%  30-34.9 kg/m2 Obese (Class I) Increased incidence by 68%  35-39.9 kg/m2 Severe obesity (Class II) Increased incidence by 136%  >40 kg/m2 Extreme obesity (Class III) Increased incidence by 254%   Patient's current BMI Ideal Body weight  Body mass index is 34.97 kg/m. Ideal body weight: 68.4 kg (150 lb 12.7 oz) Adjusted ideal body weight: 82.8 kg (182 lb 7.6 oz)   BMI Readings from Last 4 Encounters:  03/09/18 34.97 kg/m  01/18/18 34.97 kg/m  02/02/17 33.91 kg/m  02/20/15 30.71 kg/m   Wt Readings from Last 4 Encounters:  03/09/18 230 lb  (104.3 kg)  01/18/18 230 lb (104.3 kg)  02/02/17 223 lb (101.2 kg)  02/20/15 202 lb (91.6 kg)  Psych/Mental status: Alert, oriented x 3 (person, place, & time)       Eyes: PERLA Respiratory: No evidence of acute respiratory distress  Cervical Spine Area Exam  Skin & Axial Inspection: No masses, redness, edema, swelling, or associated skin lesions Alignment: Symmetrical Functional ROM: Unrestricted ROM      Stability: No instability detected Muscle Tone/Strength: Functionally intact. No obvious neuro-muscular anomalies detected. Sensory (Neurological): Unimpaired Palpation: No palpable anomalies              Upper Extremity (UE) Exam    Side: Right upper extremity  Side: Left upper extremity  Skin & Extremity Inspection: Skin color, temperature, and hair growth are WNL. No peripheral edema or cyanosis. No masses, redness, swelling, asymmetry, or associated skin lesions. No contractures.  Skin & Extremity Inspection: Skin color, temperature, and hair growth are WNL. No peripheral edema or cyanosis. No masses, redness, swelling, asymmetry, or associated skin lesions. No contractures.  Functional ROM: Unrestricted ROM          Functional ROM: Unrestricted ROM          Muscle Tone/Strength: Functionally intact. No obvious neuro-muscular anomalies detected.  Muscle Tone/Strength: Functionally intact. No obvious neuro-muscular anomalies detected.  Sensory (Neurological): Unimpaired  Sensory (Neurological): Unimpaired          Palpation: No palpable anomalies              Palpation: No palpable anomalies              Provocative Test(s):  Phalen's test: deferred Tinel's test: deferred Apley's scratch test (touch opposite shoulder):  Action 1 (Across chest): deferred Action 2 (Overhead): deferred Action 3 (LB reach): deferred   Provocative Test(s):  Phalen's test: deferred Tinel's test: deferred Apley's scratch test (touch opposite shoulder):  Action 1 (Across chest):  deferred Action 2 (Overhead): deferred Action 3 (LB reach): deferred    Thoracic Spine Area Exam  Skin & Axial Inspection: No masses, redness, or swelling Alignment: Symmetrical Functional ROM: Unrestricted ROM Stability: No instability detected Muscle Tone/Strength: Functionally intact. No obvious neuro-muscular anomalies detected. Sensory (Neurological): Unimpaired Muscle strength & Tone: No palpable anomalies  Lumbar Spine Area Exam  Skin & Axial Inspection: No masses, redness, or swelling Alignment: Symmetrical Functional ROM: Decreased ROM affecting both sides Stability: No instability detected Muscle Tone/Strength: Functionally intact. No obvious neuro-muscular anomalies detected. Sensory (Neurological): Musculoskeletal pain pattern and articular Palpation: No palpable anomalies       Provocative Tests: Hyperextension/rotation test: (+) bilaterally for facet joint pain. Lumbar quadrant test (Kemp's test): deferred today       Lateral bending test: (+) due to pain. Patrick's Maneuver: (+) for bilateral S-I arthralgia             FABER test: (+) for bilateral S-I arthralgia             S-I anterior distraction/compression test: deferred today         S-I lateral compression test: deferred today         S-I Thigh-thrust test: deferred today         S-I Gaenslen's test: deferred today          Gait & Posture Assessment  Ambulation: Limited Gait: Antalgic Posture: Difficulty standing up straight, due to pain   Lower Extremity Exam    Side: Right lower extremity  Side: Left lower extremity  Stability: No instability observed          Stability: No instability observed          Skin & Extremity Inspection: Skin color, temperature, and hair growth are WNL. No peripheral edema or cyanosis. No masses, redness, swelling, asymmetry, or associated skin lesions. No contractures.  Skin & Extremity Inspection: Skin color, temperature, and hair growth are WNL. No peripheral edema or  cyanosis. No masses, redness, swelling, asymmetry, or associated skin lesions. No contractures.  Functional ROM: Unrestricted ROM                  Functional ROM: Unrestricted ROM                  Muscle Tone/Strength: Functionally intact. No obvious neuro-muscular anomalies detected.  Muscle Tone/Strength: Functionally intact. No obvious neuro-muscular anomalies detected.  Sensory (Neurological): Unimpaired  Sensory (Neurological): Unimpaired  Palpation: No palpable anomalies  Palpation: No palpable anomalies   Assessment  Primary Diagnosis & Pertinent Problem List: The primary encounter diagnosis was Lumbar facet arthropathy (L3,4,5). Diagnoses of Lumbar spondylosis, SI joint arthritis, and Chronic pain syndrome were also pertinent to this visit.  Status Diagnosis  Persistent Persistent Persistent 1. Lumbar facet arthropathy (L3,4,5)   2. Lumbar spondylosis   3. SI joint arthritis   4. Chronic pain syndrome  General Recommendations: The pain condition that the patient suffers from is best treated with a multidisciplinary approach that involves an increase in physical activity to prevent de-conditioning and worsening of the pain cycle, as well as psychological counseling (formal and/or informal) to address the co-morbid psychological affects of pain. Treatment will often involve judicious use of pain medications and interventional procedures to decrease the pain, allowing the patient to participate in the physical activity that will ultimately produce long-lasting pain reductions. The goal of the multidisciplinary approach is to return the patient to a higher level of overall function and to restore their ability to perform activities of daily living.  Patient is high risk for opioid prescribing. The patient will not be a candidate for opioid therapy through this clinic. Patient will be optimized on non-opioid adjunctive analgesics and a comprehensive pain management plan was  developed.  DANISH RUFFINS has a history of greater than 3 months of moderate to severe pain which is resulted in functional impairment.  The patient has tried various conservative therapeutic options such as NSAIDs, Tylenol, muscle relaxants, physical therapy which was inadequately effective.  Patient's pain is predominantly axial with physical exam findings suggestive of facet arthropathy.  Lumbar facet medial branch nerve blocks were discussed with the patient.  Risks and benefits were reviewed.  Patient would like to proceed with bilateral L3, L4, L5 medial branch nerve block.  Patient's lumbar spine x-ray shows lumbar spondylosis and facet arthropathy at L3, L4, L5.  Patient also has mild degenerative changes involving his bilateral sacroiliac joints details of which are below.  Patient's right ankle x-ray was largely unremarkable although he is continuing to endorse significant right ankle pain.  He was evaluated by orthopedics recently and is scheduled to have an MRI of his right ankle to evaluate for any ligamentous injury.  In regards to medication management, patient is tolerating Lyrica 25 mg 3 times a day.  He does not want to increase the dose. Will provide refill.  He was unable to pick up the diclofenac.  This will be represcribed.  Note: patient will not be a candidate for chronic opioid therapy given a history of nasal abscess for snorting Percocet.   Plan of Care  Pharmacotherapy (Medications Ordered): Meds ordered this encounter  Medications  . diclofenac (VOLTAREN) 75 MG EC tablet    Sig: Take 1 tablet (75 mg total) by mouth 2 (two) times daily.    Dispense:  60 tablet    Refill:  0  . pregabalin (LYRICA) 25 MG capsule    Sig: Take 1 capsule (25 mg total) by mouth 3 (three) times daily.    Dispense:  90 capsule    Refill:  3    Do not place this medication, or any other prescription from our practice, on "Automatic Refill". Patient may have prescription filled one day early  if pharmacy is closed on scheduled refill date.   Lab-work, procedure(s), and/or referral(s): Orders Placed This Encounter  Procedures  . LUMBAR FACET(MEDIAL BRANCH NERVE BLOCK) MBNB    Considering:   Bilateral lumbar facet medial branch nerve blocks Sacroiliac joint injection   PRN Procedures:   To be determined at a later time  Time Note: Greater than 50% of the 25 minute(s) of face-to-face time spent with Mr. Boody, was spent in counseling/coordination of care regarding: Mr. Steeves primary cause of pain, the results of his recent test(s), the significance of each one oth the test(s) anomalies and it's corresponding characteristic pain pattern(s), the  treatment plan, treatment alternatives, the risks and possible complications of proposed treatment, medication side effects, going over the informed consent, realistic expectations and the goals of pain management (increased in functionality).  Provider-requested follow-up: Return in about 3 weeks (around 03/30/2018) for Procedure.  Future Appointments  Date Time Provider [place]  03/10/2018  1:00 PM Madaline Savage, PT [facility] None  03/31/2018 10:30 AM Gillis Santa, MD [facility] None    Primary Care Physician: Freddy Finner, NP Location: [facility] Note by: Gillis Santa, M.D Date: 03/09/2018; Time: 2:13 PM  Patient Instructions  Prescriptions for Lyrica and Voltaren were sent to your pharmacy. ____________________________________________________________________________________________  Preparing for Procedure with Sedation  Instructions: . Oral Intake: Do not eat or drink anything for at least 8 hours prior to your procedure. . Transportation: Public transportation is not allowed. Bring an adult driver. The driver must be physically present in our waiting room before any procedure can be started. [place] Physical Assistance: Bring an adult physically capable of assisting you, in the event you  need help. This adult should keep you company at home for at least 6 hours after the procedure. . Blood Pressure Medicine: Take your blood pressure medicine with a sip of water the morning of the procedure. . Blood thinners: Notify our staff if you are taking any blood thinners. Depending on which one you take, there will be specific instructions on how and when to stop it. . Diabetics on insulin: Notify the staff so that you can be scheduled 1st case in the morning. If your diabetes requires high dose insulin, take only  of your normal insulin dose the morning of the procedure and notify the staff that you have done so. . Preventing infections: Shower with an antibacterial soap the morning of your procedure. . Build-up your immune system: Take 1000 mg of Vitamin C with every meal (3 times a day) the day prior to your procedure. Marland Kitchen Antibiotics: Inform the staff if you have a condition or reason that requires you to take antibiotics before dental procedures. . Pregnancy: If you are pregnant, call and cancel the procedure. . Sickness: If you have a cold, fever, or any active infections, call and cancel the procedure. . Arrival: You must be in the facility at least 30 minutes prior to your scheduled procedure. . Children: Do not bring children with you. . Dress appropriately: Bring dark clothing that you would not mind if they get stained. . Valuables: Do not bring any jewelry or valuables.  Procedure appointments are reserved for interventional treatments only. Marland Kitchen No Prescription Refills. . No medication changes will be discussed during procedure appointments. . No disability issues will be discussed.  Reasons to call and reschedule or cancel your procedure: (Following these recommendations will minimize the risk of a serious complication.) . Surgeries: Avoid having procedures within 2 weeks of any surgery. (Avoid for 2 weeks before or after any surgery). . Flu Shots: Avoid having procedures within  2 weeks of a flu shots or . (Avoid for 2 weeks before or after immunizations). . Barium: Avoid having a procedure within 7-10 days after having had a radiological study involving the use of radiological contrast. (Myelograms, Barium swallow or enema study). . Heart attacks: Avoid any elective procedures or surgeries for the initial 6 months after a "Myocardial Infarction" (Heart Attack). . Blood thinners: It is imperative that you stop these medications before procedures. Let us know if you if you take any blood thinner.  . Infection:  Avoid procedures during or within two weeks of an infection (including chest colds or gastrointestinal problems). Symptoms associated with infections include: Localized redness, fever, chills, night sweats or profuse sweating, burning sensation when voiding, cough, congestion, stuffiness, runny nose, sore throat, diarrhea, nausea, vomiting, cold or Flu symptoms, recent or current infections. It is specially important if the infection is over the area that we intend to treat. Marland Kitchen Heart and lung problems: Symptoms that may suggest an active cardiopulmonary problem include: cough, chest pain, breathing difficulties or shortness of breath, dizziness, ankle swelling, uncontrolled high or unusually low blood pressure, and/or palpitations. If you are experiencing any of these symptoms, cancel your procedure and contact your primary care physician for an evaluation.  Remember:  Regular Business hours are:  Monday to Thursday 8:00 AM to 4:00 PM  Provider's Schedule: Milinda Pointer, MD:  Procedure days: Tuesday and Thursday 7:30 AM to 4:00 PM  Gillis Santa, MD:  Procedure days: Monday and Wednesday 7:30 AM to 4:00 PM ____________________________________________________________________________________________

## 2018-03-10 ENCOUNTER — Ambulatory Visit: Payer: Medicaid Other | Attending: Primary Care

## 2018-03-10 DIAGNOSIS — G8929 Other chronic pain: Secondary | ICD-10-CM | POA: Insufficient documentation

## 2018-03-10 DIAGNOSIS — M6281 Muscle weakness (generalized): Secondary | ICD-10-CM | POA: Insufficient documentation

## 2018-03-10 DIAGNOSIS — M545 Low back pain, unspecified: Secondary | ICD-10-CM

## 2018-03-10 DIAGNOSIS — R262 Difficulty in walking, not elsewhere classified: Secondary | ICD-10-CM | POA: Insufficient documentation

## 2018-03-10 NOTE — Patient Instructions (Addendum)
Medbridge Access Code: [id number]   Hip Extension with Resistance Loop  10x3 each LE  HEP to be performed with blood pressure at appropriate level. Pt verbalized understanding.

## 2018-03-10 NOTE — Therapy (Signed)
[facility] [address], [place], [address] Phone: [phone]   Fax:  [phone]  Physical Therapy Treatment And Discharge Summary  Patient Details  Name: Jason [name] MRN: [id number] Date of Birth: 17-Jun-1965 Referring Provider (PT): Freddy Finner, FNP   Encounter Date: 03/10/2018  PT End of Session - 03/10/18 1308    Visit Number  6    Number of Visits  14    Date for PT Re-Evaluation  03/10/18    Authorization Type  6    Authorization Time Period  of 12 Medicaid    PT Start Time  1950   pt arrived late   PT Stop Time  1350    PT Time Calculation (min)  42 min    Activity Tolerance  Patient tolerated treatment well    Behavior During Therapy  [facility] for tasks assessed/performed       Past Medical History:  Diagnosis Date  . Anxiety   . Arthritis   . Chronic low back pain   . Chronic pain 2002  . Degenerative disc disease, lumbar   . Depression   . Diverticulosis of colon   . History of hiatal hernia   . Hypertension   . Incomplete right bundle branch block   . OSA on CPAP   . Right nephrolithiasis   . Urgency of urination   . Wears partial dentures    UPPER AND LOWER    Past Surgical History:  Procedure Laterality Date  . COLONOSCOPY W/ BIOPSIES AND POLYPECTOMY    . CYSTOSCOPY WITH HOLMIUM LASER LITHOTRIPSY Right 02/14/2015   Procedure: CYSTOSCOPY WITH HOLMIUM LASER LITHOTRIPSY;  Surgeon: Alexis Frock, MD;  Location: [facility];  Service: Urology;  Laterality: Right;  . CYSTOSCOPY WITH RETROGRADE PYELOGRAM, URETEROSCOPY AND STENT PLACEMENT Right 02/14/2015   Procedure: CYSTOSCOPY WITH RETROGRADE PYELOGRAM, URETEROSCOPY AND STENT PLACEMENT;  Surgeon: Alexis Frock, MD;  Location: [facility];  Service: Urology;  Laterality: Right;  . KIDNEY STONE SURGERY    . NO PAST SURGERIES    . STONE EXTRACTION WITH BASKET Right 02/14/2015   Procedure: STONE EXTRACTION  WITH BASKET;  Surgeon: Alexis Frock, MD;  Location: [facility];  Service: Urology;  Laterality: Right;    There were no vitals filed for this visit.  Subjective Assessment - 03/10/18 1314    Subjective  Back still a little sore. Went to pain management yesterday and doing something with the nerves. Has been doing his exercises at home. Feet are still hurting.  Feels anterior medial L ankle and foot pain. Doctor from [facility] says that it is a bone splinter. Getting an MRI Friday for his R foot.  Just the R foot is bothering him. No back pain currently because he has been taking Lyrica.   4/10 back pain at most for the past 7 days.  Pt states that he is progressing pretty good towards goals for PT, probably above schedule. Feels like a good day to graduate from PT. Does his HEP.      Pertinent History  Chronic low back pain. Back pain began about 12 years ago in 2007. 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 Could not move afterwards. Went to urgent care and was diagnosed with arthritis. Also has bilateral foot pain including his ankles. Has an appointment with podiatry. Trying to stay off his feet as much as possible. Also quit smoking  but gained 10 lbs. Seems like everytime he quits, he gets 10 lbs. Also has an inversion table this helps his back. Currently has difficulty putting his socks on. Also goes to a chiropractor which helps out too.  Went to 1 PT appointment at [facility] about 3 years ago but had to stop because it was too far.  Doing the exercise when he is on all fours and pushing up helped. Has a hard time bending over to put his socks and shoes on. Unable to hold his bowel at times when he needs to go. Happened about a week ago. His doctor does not know about that. Able to hold his bowels if it is not loose. Has accidents when his bowel is loose/diarrhea. Able to hold urine. Denies saddle anesthesia.     Patient Stated Goals  Goals: be better able to  put his socks on.     Currently in Pain?  No/denies    Pain Score  0-No pain    Pain Onset  More than a month ago         [facility] PT Assessment - 03/10/18 1325      Strength   Right Hip Extension  4+/5    Right Hip ABduction  4+/5    Left Hip Extension  4+/5    Left Hip ABduction  4+/5                           PT Education - 03/10/18 1323    Education Details  ther-ex, HEP, progress/current status, POC    Person(s) Educated  Patient    Methods  Explanation;Demonstration;Tactile cues;Verbal cues;Handout    Comprehension  Returned demonstration;Verbalized understanding        Objective   Medbridge Access Code: [email or web address]      Pt states that he is progressing pretty good towards goals for PT, probably above schedule. Feels like a good day to graduate from PT. Does his HEP.   Therapeutic exercise   Blood pressure L arm sitting, mechanically taken, normal cuff: 129/87, HR101. Pt states no lightheadedness or dizziness.   Manually resisted hip extension, hip abduction,  1-2x each LE  Reviewed progress/current status with PT towards goals.  Standing hip extension 10x3 each LE red band  Reviewed HEP.   Sitting with hip hinging to reach toes. Decreased back difficulty with motion to don and doff sock and shoe.    Improved exercise technique, movement at target joints, use of target muscles after min to mod verbal, visual, tactile cues.    Patient demonstrates decreased low back pain level, improved bilateral glute max strength, and improved function since initial evaluation. Pt also demonstrates consistency and independence with his HEP. Skilled physical therapy services discharged after today's recert with patient continuing progress with his exercises at home.     PT Short Term Goals - 03/10/18 1339      PT SHORT TERM GOAL #1   Title  Patient will be independent with his HEP to promote ability to perform tasks such as picking up items from  the floor or donning and doffing his socks and shoes with less back pain.     Baseline  Pt has started a limited home exercise program. (12/29/2017); Pt compliant with his HEP. More home exercises periodically added (01/26/2018); Pt compliant with HEP and demonstrates independence (03/10/2018)    Time  3    Period  Weeks    Status  Achieved  PT Long Term Goals - 03/10/18 1318      PT LONG TERM GOAL #1   Title  Patient will have a decrease in back pain to 4/10 or less at worst to promote ability to perform tasks such as picking up items from the floor or donning and doffing his socks and shoes with less back pain.     Baseline  8/10 back pain at worst for the past month (12/29/2017); 5/10 back pain at worst for the past 7 days (01/26/2018); 4/10 back pain at worst for the past 7 days (03/10/2018).     Time  5    Period  Weeks    Status  Achieved    Target Date  03/04/18      PT LONG TERM GOAL #2   Title  Patient will improve bilateral hip extension and abduction strength by at least 1/2 MMT grade to promote ability to perform standing tasks with less pain.     Baseline  Hip extension: 4-/5 R, 4/5 L, hip abduction: 4/5 R, 4/5 L (12/29/2017); hip extension 4-/5 R, 4/5 L, hip abduction 4+/5 bilaterally (01/26/2018); hip extension, hip abduction 4+/5 bilaterally (03/10/2018)    Time  5    Period  Weeks    Status  Partially Met    Target Date  03/04/18      PT LONG TERM GOAL #3   Title  Patient will improve his Modified Oswestry Low Back Pain Disability questionnaire score by at least 10% as a demonstration of improved function.     Baseline  66% (12/29/2017); 42% (01/26/2018)    Time  4    Period  Weeks    Status  Achieved    Target Date  01/28/18      PT LONG TERM GOAL #4   Title  Pt will report less difficulty reaching his feet to promote ability to don and doff his socks and shoes.     Baseline  Pt reports difficulty reaching his feet secondary to low back tightness (01/26/2018),  (03/10/2018)    Time  5    Period  Weeks    Status  On-going    Target Date  03/04/18            Plan - 03/10/18 1311    Clinical Impression Statement  Patient demonstrates decreased low back pain level, improved bilateral glute max strength, and improved function since initial evaluation. Pt also demonstrates consistency and independence with his HEP. Skilled physical therapy services discharged after today's recert with patient continuing progress with his exercises at home.     Rehab Potential  Fair    Clinical Impairments Affecting Rehab Potential  (-) chronicity of condition, multiple areas of pain; (+) motivted    PT Frequency  2x / week    PT Duration  Other (comment)   5 weeks   PT Treatment/Interventions  Therapeutic activities;Therapeutic exercise;Neuromuscular re-education;Patient/family education;Manual techniques;Dry needling;Aquatic Therapy;Electrical Stimulation;Iontophoresis 54m/ml Dexamethasone;Traction;Ultrasound;Gait training    PT Next Visit Plan  back extension, hip strengthening, ergonomics, manual techniques, modalities PRN    Consulted and Agree with Plan of Care  Patient       Patient will benefit from skilled therapeutic intervention in order to improve the following deficits and impairments:  Pain, Postural dysfunction, Improper body mechanics, Decreased strength, Difficulty walking, Decreased range of motion  Visit Diagnosis: Chronic low back pain, unspecified back pain laterality, unspecified whether sciatica present - Plan: PT plan of care cert/re-cert  Muscle weakness (generalized) -  Plan: PT plan of care cert/re-cert  Difficulty in walking, not elsewhere classified - Plan: PT plan of care cert/re-cert     Problem List Patient Active Problem List   Diagnosis Date Noted  . Chronic pain syndrome 01/18/2018  . Chronic pain of right ankle 01/18/2018  . OSA (obstructive sleep apnea) 12/20/2014  . Musculoskeletal back pain 09/08/2013  . Depression  09/08/2013  . HTN (hypertension) 09/08/2013    Thank you for your referral.  Joneen Boers PT, DPT   03/10/2018, 4:40 PM  [facility] [address] [place], [address] Phone: [phone]   Fax:  [phone]  Name: VEDANSH KERSTETTER MRN: [id number] Date of Birth: May 19, 1966

## 2018-03-31 ENCOUNTER — Ambulatory Visit: Payer: Medicaid Other | Admitting: Student in an Organized Health Care Education/Training Program

## 2018-04-07 ENCOUNTER — Encounter: Payer: Self-pay | Admitting: Student in an Organized Health Care Education/Training Program

## 2018-04-07 ENCOUNTER — Other Ambulatory Visit: Payer: Self-pay

## 2018-04-07 ENCOUNTER — Ambulatory Visit
Payer: Medicaid Other | Attending: Student in an Organized Health Care Education/Training Program | Admitting: Student in an Organized Health Care Education/Training Program

## 2018-04-07 VITALS — BP 157/99 | HR 74 | Temp 98.1°F | Resp 18 | Ht 68.0 in | Wt 230.0 lb

## 2018-04-07 DIAGNOSIS — Z5309 Procedure and treatment not carried out because of other contraindication: Secondary | ICD-10-CM | POA: Insufficient documentation

## 2018-04-07 DIAGNOSIS — M47816 Spondylosis without myelopathy or radiculopathy, lumbar region: Secondary | ICD-10-CM

## 2018-04-07 NOTE — Progress Notes (Signed)
Patient to reschedule.  Did not abide by n.p.o. guidelines.

## 2018-04-07 NOTE — Patient Instructions (Signed)
____________________________________________________________________________________________  Preparing for Procedure with Sedation  Instructions: . Oral Intake: Do not eat or drink anything for at least 8 hours prior to your procedure. . Transportation: Public transportation is not allowed. Bring an adult driver. The driver must be physically present in our waiting room before any procedure can be started. . Physical Assistance: Bring an adult physically capable of assisting you, in the event you need help. This adult should keep you company at home for at least 6 hours after the procedure. . Blood Pressure Medicine: Take your blood pressure medicine with a sip of water the morning of the procedure. . Blood thinners: Notify our staff if you are taking any blood thinners. Depending on which one you take, there will be specific instructions on how and when to stop it. . Diabetics on insulin: Notify the staff so that you can be scheduled 1st case in the morning. If your diabetes requires high dose insulin, take only  of your normal insulin dose the morning of the procedure and notify the staff that you have done so. . Preventing infections: Shower with an antibacterial soap the morning of your procedure. . Build-up your immune system: Take 1000 mg of Vitamin C with every meal (3 times a day) the day prior to your procedure. . Antibiotics: Inform the staff if you have a condition or reason that requires you to take antibiotics before dental procedures. . Pregnancy: If you are pregnant, call and cancel the procedure. . Sickness: If you have a cold, fever, or any active infections, call and cancel the procedure. . Arrival: You must be in the facility at least 30 minutes prior to your scheduled procedure. . Children: Do not bring children with you. . Dress appropriately: Bring dark clothing that you would not mind if they get stained. . Valuables: Do not bring any jewelry or valuables.  Procedure  appointments are reserved for interventional treatments only. . No Prescription Refills. . No medication changes will be discussed during procedure appointments. . No disability issues will be discussed.  Reasons to call and reschedule or cancel your procedure: (Following these recommendations will minimize the risk of a serious complication.) . Surgeries: Avoid having procedures within 2 weeks of any surgery. (Avoid for 2 weeks before or after any surgery). . Flu Shots: Avoid having procedures within 2 weeks of a flu shots or . (Avoid for 2 weeks before or after immunizations). . Barium: Avoid having a procedure within 7-10 days after having had a radiological study involving the use of radiological contrast. (Myelograms, Barium swallow or enema study). . Heart attacks: Avoid any elective procedures or surgeries for the initial 6 months after a "Myocardial Infarction" (Heart Attack). . Blood thinners: It is imperative that you stop these medications before procedures. Let us know if you if you take any blood thinner.  . Infection: Avoid procedures during or within two weeks of an infection (including chest colds or gastrointestinal problems). Symptoms associated with infections include: Localized redness, fever, chills, night sweats or profuse sweating, burning sensation when voiding, cough, congestion, stuffiness, runny nose, sore throat, diarrhea, nausea, vomiting, cold or Flu symptoms, recent or current infections. It is specially important if the infection is over the area that we intend to treat. . Heart and lung problems: Symptoms that may suggest an active cardiopulmonary problem include: cough, chest pain, breathing difficulties or shortness of breath, dizziness, ankle swelling, uncontrolled high or unusually low blood pressure, and/or palpitations. If you are experiencing any of these symptoms, cancel   your procedure and contact your primary care physician for an evaluation.  Remember:   Regular Business hours are:  Monday to Thursday 8:00 AM to 4:00 PM  Provider's Schedule: Francisco Naveira, MD:  Procedure days: Tuesday and Thursday 7:30 AM to 4:00 PM  Bilal Lateef, MD:  Procedure days: Monday and Wednesday 7:30 AM to 4:00 PM ____________________________________________________________________________________________    

## 2018-04-07 NOTE — Progress Notes (Signed)
Safety precautions to be maintained throughout the outpatient stay will include: orient to surroundings, keep bed in low position, maintain call bell within reach at all times, provide assistance with transfer out of bed and ambulation.  

## 2018-04-12 ENCOUNTER — Other Ambulatory Visit: Payer: Self-pay | Admitting: Student in an Organized Health Care Education/Training Program

## 2018-04-12 ENCOUNTER — Ambulatory Visit (HOSPITAL_BASED_OUTPATIENT_CLINIC_OR_DEPARTMENT_OTHER): Payer: Medicaid Other | Admitting: Student in an Organized Health Care Education/Training Program

## 2018-04-12 VITALS — BP 160/114 | HR 81 | Temp 97.8°F | Resp 16 | Ht 68.0 in | Wt 230.0 lb

## 2018-04-12 DIAGNOSIS — G894 Chronic pain syndrome: Secondary | ICD-10-CM

## 2018-04-12 DIAGNOSIS — G8929 Other chronic pain: Secondary | ICD-10-CM

## 2018-04-12 DIAGNOSIS — M25571 Pain in right ankle and joints of right foot: Principal | ICD-10-CM

## 2018-12-03 ENCOUNTER — Telehealth: Payer: Self-pay | Admitting: *Deleted

## 2018-12-03 NOTE — Telephone Encounter (Signed)
Received referral for lung screening scan. Attempted to contact, voicemail left notifying patient that he is not eligible for lung screening until he is 53 years old. He will be contacted at that time to schedule lung screening. My contact number is given in case he has any questions.

## 2019-05-23 DIAGNOSIS — E119 Type 2 diabetes mellitus without complications: Secondary | ICD-10-CM | POA: Insufficient documentation

## 2019-09-09 ENCOUNTER — Other Ambulatory Visit: Payer: Self-pay

## 2019-09-09 ENCOUNTER — Ambulatory Visit: Payer: Medicaid Other | Attending: Internal Medicine

## 2019-09-09 DIAGNOSIS — Z23 Encounter for immunization: Secondary | ICD-10-CM

## 2019-09-09 NOTE — Progress Notes (Signed)
   Covid-19 Vaccination Clinic  Name:  Jason [name]    MRN: [id number] DOB: 11-01-1965  09/09/2019  [name] was observed post Covid-19 immunization for 15 minutes without incident. He was provided with Vaccine Information Sheet and instruction to access the V-Safe system.   [name] was instructed to call 911 with any severe reactions post vaccine: Marland Kitchen Difficulty breathing  . Swelling of face and throat  . A fast heartbeat  . A bad rash all over body  . Dizziness and weakness   Immunizations Administered    Name Date Dose VIS Date Route   Pfizer COVID-19 Vaccine 09/09/2019 11:21 AM 0.3 mL 05/20/2019 Intramuscular   Manufacturer: [employer], [employer]   Lot: [id number]   NDC: [address]-3

## 2019-10-05 ENCOUNTER — Ambulatory Visit: Payer: Medicaid Other | Attending: Internal Medicine

## 2019-10-05 DIAGNOSIS — Z23 Encounter for immunization: Secondary | ICD-10-CM

## 2019-10-05 NOTE — Progress Notes (Signed)
   Covid-19 Vaccination Clinic  Name:  MEAD SLANE    MRN: [id number] DOB: 11-Aug-1965  10/05/2019  Mr. Mertz was observed post Covid-19 immunization for 15 minutes without incident. He was provided with Vaccine Information Sheet and instruction to access the V-Safe system.   Mr. Yarbrough was instructed to call 911 with any severe reactions post vaccine: Marland Kitchen Difficulty breathing  . Swelling of face and throat  . A fast heartbeat  . A bad rash all over body  . Dizziness and weakness   Immunizations Administered    Name Date Dose VIS Date Route   Pfizer COVID-19 Vaccine 10/05/2019  9:31 AM 0.3 mL 08/03/2018 Intramuscular   Manufacturer: [employer], [employer]   Lot: [id number]   NDC: [address]-3

## 2019-12-09 DIAGNOSIS — G3184 Mild cognitive impairment, so stated: Secondary | ICD-10-CM | POA: Insufficient documentation

## 2020-02-22 ENCOUNTER — Telehealth: Payer: Self-pay

## 2020-02-22 NOTE — Telephone Encounter (Signed)
Contacted patient for lung CT screening program after receiving referral from Sandrea Hughs in 11/2018.  Patient was contacted at that time to inform that program begins for patients at age 54.  Guidelines have now changed and patient's are eligible at age 80.  Message left for patient to call Glenna Fellows, lung navigator to be refreshed about the program and schedule a lung CT scan.

## 2020-02-29 ENCOUNTER — Telehealth: Payer: Self-pay

## 2020-02-29 DIAGNOSIS — Z122 Encounter for screening for malignant neoplasm of respiratory organs: Secondary | ICD-10-CM

## 2020-02-29 DIAGNOSIS — Z87891 Personal history of nicotine dependence: Secondary | ICD-10-CM

## 2020-02-29 NOTE — Telephone Encounter (Signed)
Contacted patient for lung CT screening clinic based on referral from Sandrea Hughs.  Message left for patient to call Glenna Fellows, lung navigator to schedule CT scan.

## 2020-03-01 ENCOUNTER — Encounter: Payer: Self-pay | Admitting: *Deleted

## 2020-03-01 NOTE — Telephone Encounter (Signed)
Received referral for initial lung cancer screening scan. Contacted patient and obtained smoking history,(former, quit 02/08/2019, 47.5 pack year) as well as answering questions related to screening process. Patient denies signs of lung cancer such as weight loss or hemoptysis. Patient denies comorbidity that would prevent curative treatment if lung cancer were found. Patient is scheduled for shared decision making visit and CT scan on 03/20/20 at 130pm.

## 2020-03-01 NOTE — Addendum Note (Signed)
Addended by: Jonne Ply on: 03/01/2020 10:09 AM   Modules accepted: Orders

## 2020-03-14 ENCOUNTER — Telehealth: Payer: Self-pay | Admitting: *Deleted

## 2020-03-14 NOTE — Telephone Encounter (Signed)
Notified by business office that our lung screening program is out of network for this patient. Mr. Jason [name] notified that we are out of network but that [facility] is in network. Patient made aware that this note will be sent to his PCP and he should anticipate a referral to [facility]. Verbalized understanding.

## 2020-03-20 ENCOUNTER — Telehealth: Payer: Medicaid Other | Admitting: Nurse Practitioner

## 2020-03-20 ENCOUNTER — Ambulatory Visit: Payer: Medicaid Other

## 2020-05-16 IMAGING — CR DG LUMBAR SPINE COMPLETE W/ BEND
1 series · 7 of 7 positions shown · non-contrast
Comparison: Bone window images from CT abdomen and pelvis
02/20/2015.

CLINICAL DATA: Chronic 10 year history of low back pain radiating
into both LOWER extremities after a lifting injury at that time.
MEDIAL RIGHT ankle pain over the past 1 month without injury.

EXAM:
LUMBAR SPINE - COMPLETE WITH BENDING VIEWS

[Series 1: dg lumbar spine complete w/bend 6+v · 0.14mm/px · 7 of 7 slices shown]
[im 1/7]
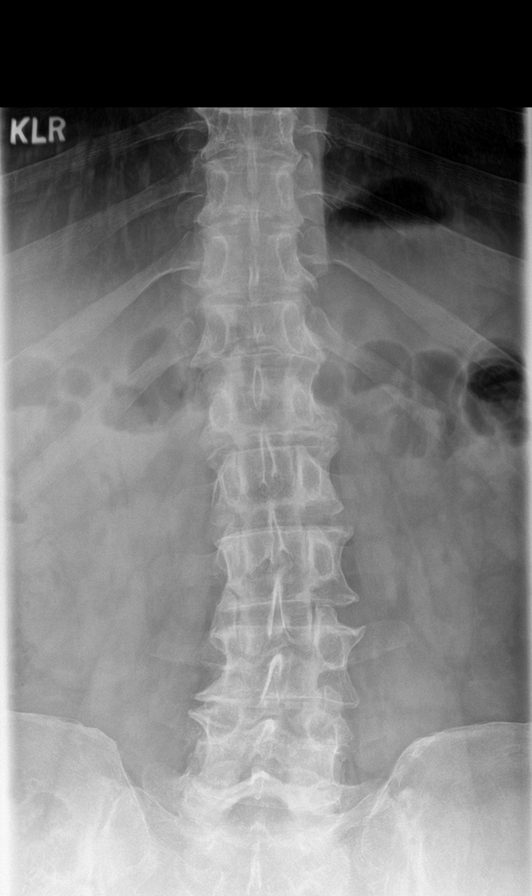
[im 2/7]
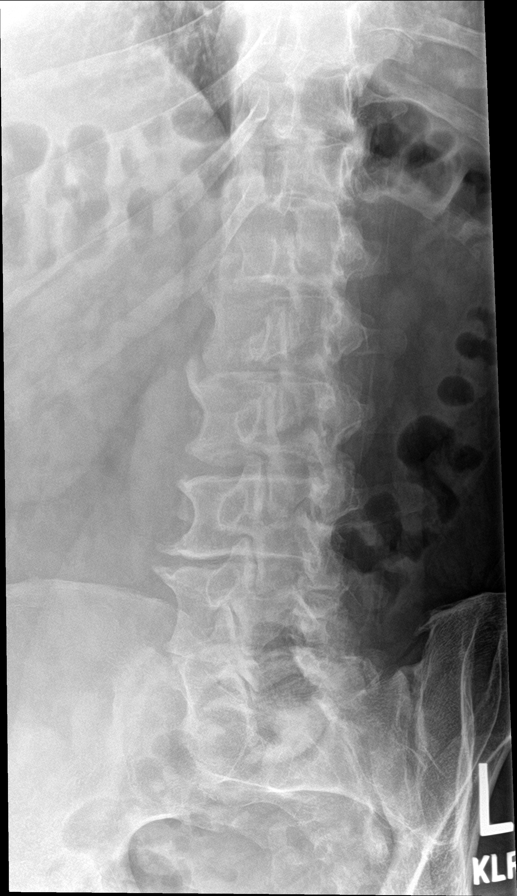
[im 3/7]
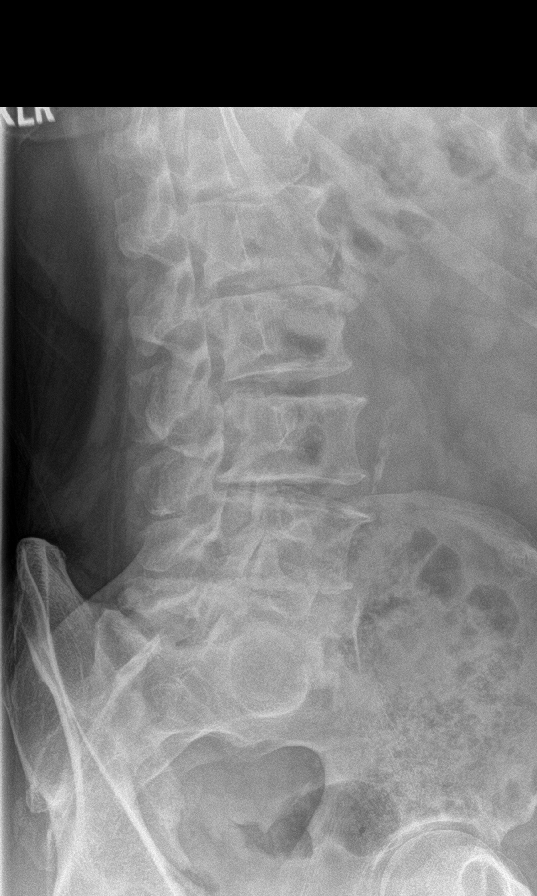
[im 4/7]
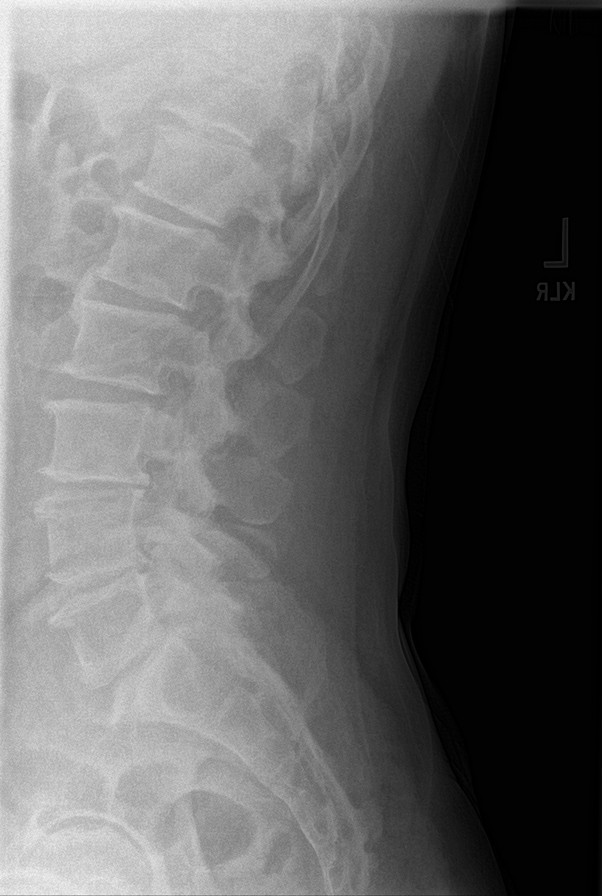
[im 5/7]
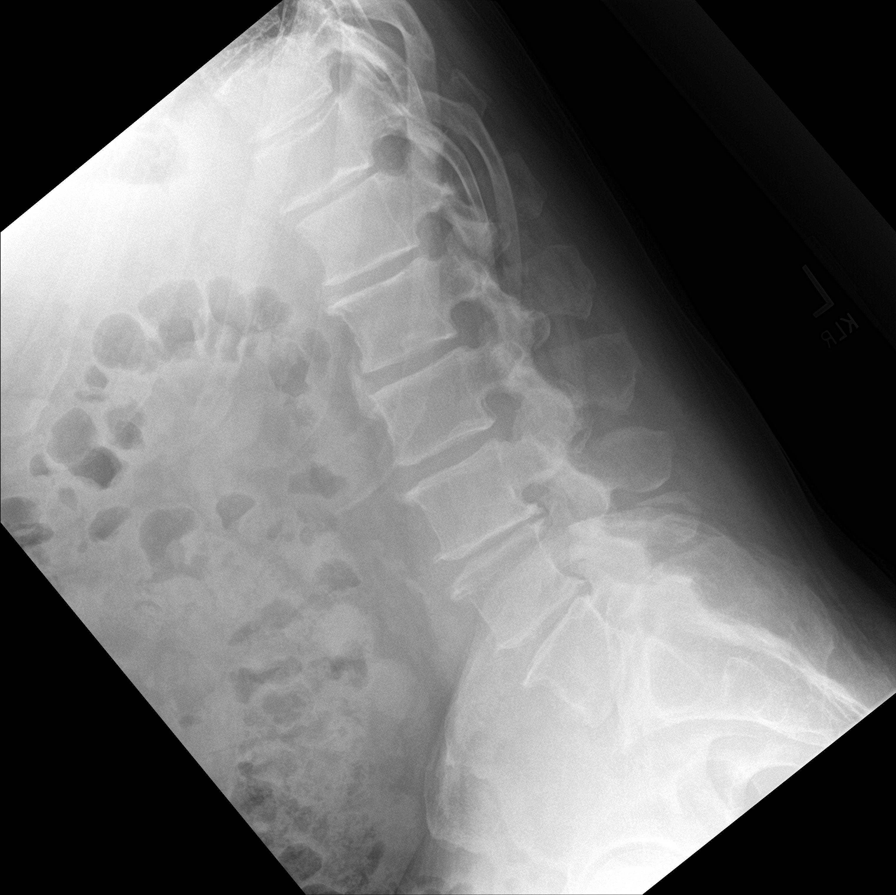
[im 6/7]
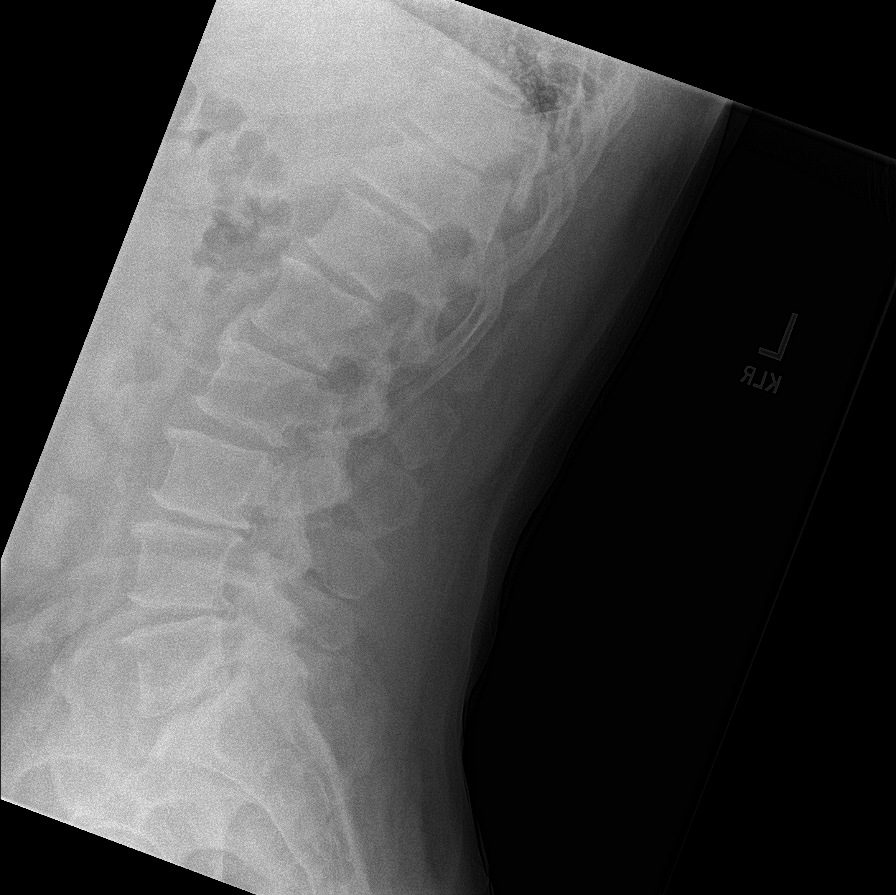
[im 7/7]
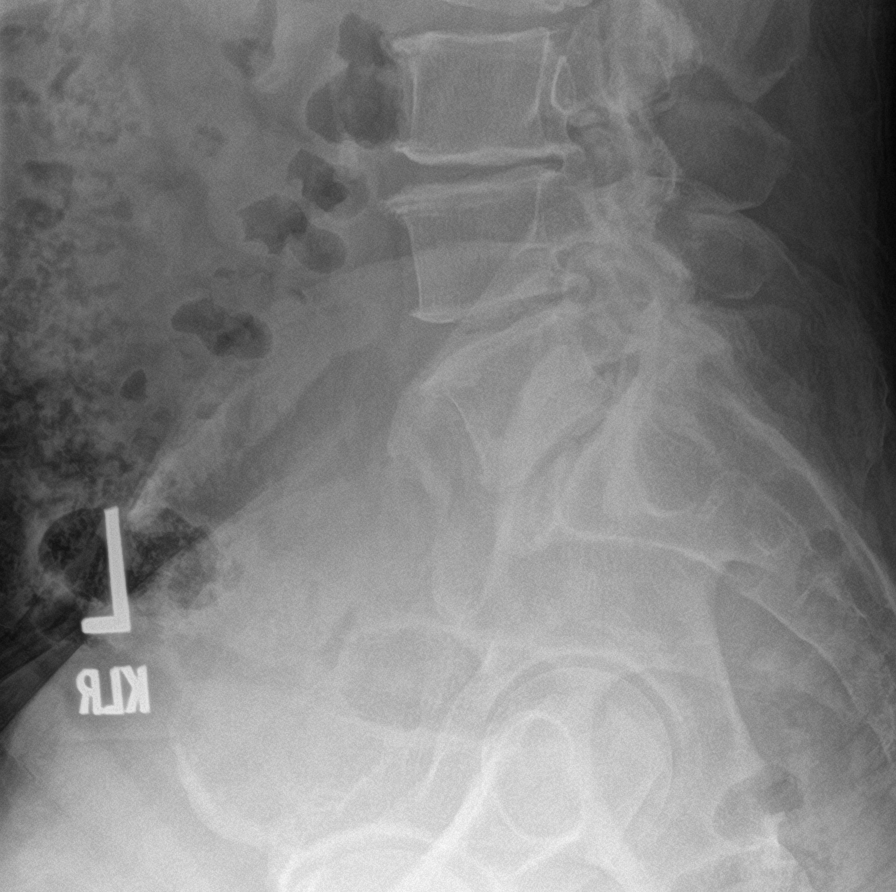

[7 of 7 positions shown; findings below may reference images not displayed]

FINDINGS: Five non-rib-bearing lumbar vertebrae are present. T12 has very
small, rudimentary ribs, confirmed on a prior chest x-ray
02/05/2015.

Anatomic posterior alignment. Slight lumbar levoscoliosis. No
fractures. Disc space narrowing at every lumbar level, greatest at
L3-4, progressive since 8048. No pars defects. Facet degenerative
changes at L3-4, L4-5 and L5-S1.

Flexion and extension views demonstrate limited range of motion but
no evidence of instability.
IMPRESSION: 1. Multilevel degenerative disc disease, spondylosis and facet
degenerative changes as detailed above.
2. Limited range of motion but no evidence of instability on flexion
and extension.

## 2020-05-16 IMAGING — CR DG ANKLE COMPLETE 3+V*R*
1 series · 3 of 3 positions shown · non-contrast
Comparison: None.

CLINICAL DATA: Chronic 10 year history of low back pain radiating
into both LOWER extremities after a lifting injury at that time.
MEDIAL RIGHT ankle pain over the past 1 month without injury.

EXAM:
RIGHT ANKLE - COMPLETE 3+ VIEW

[Series 1: dg ankle complete right · 0.14mm/px · 3 of 3 slices shown]
[im 1/3]
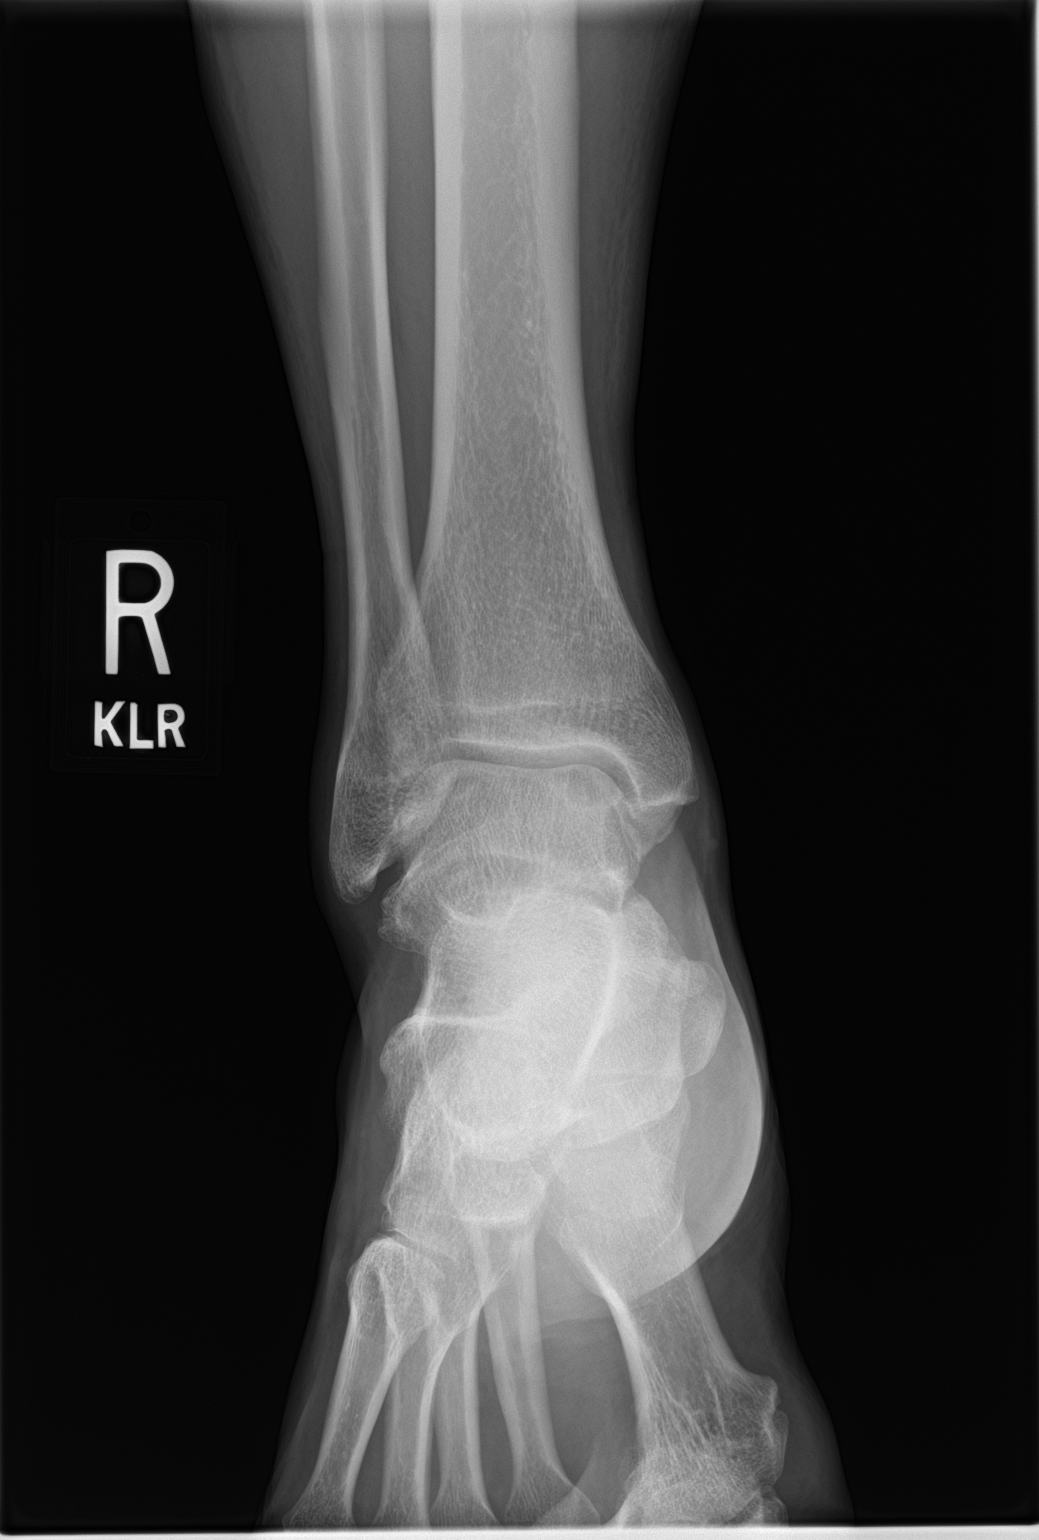
[im 2/3]
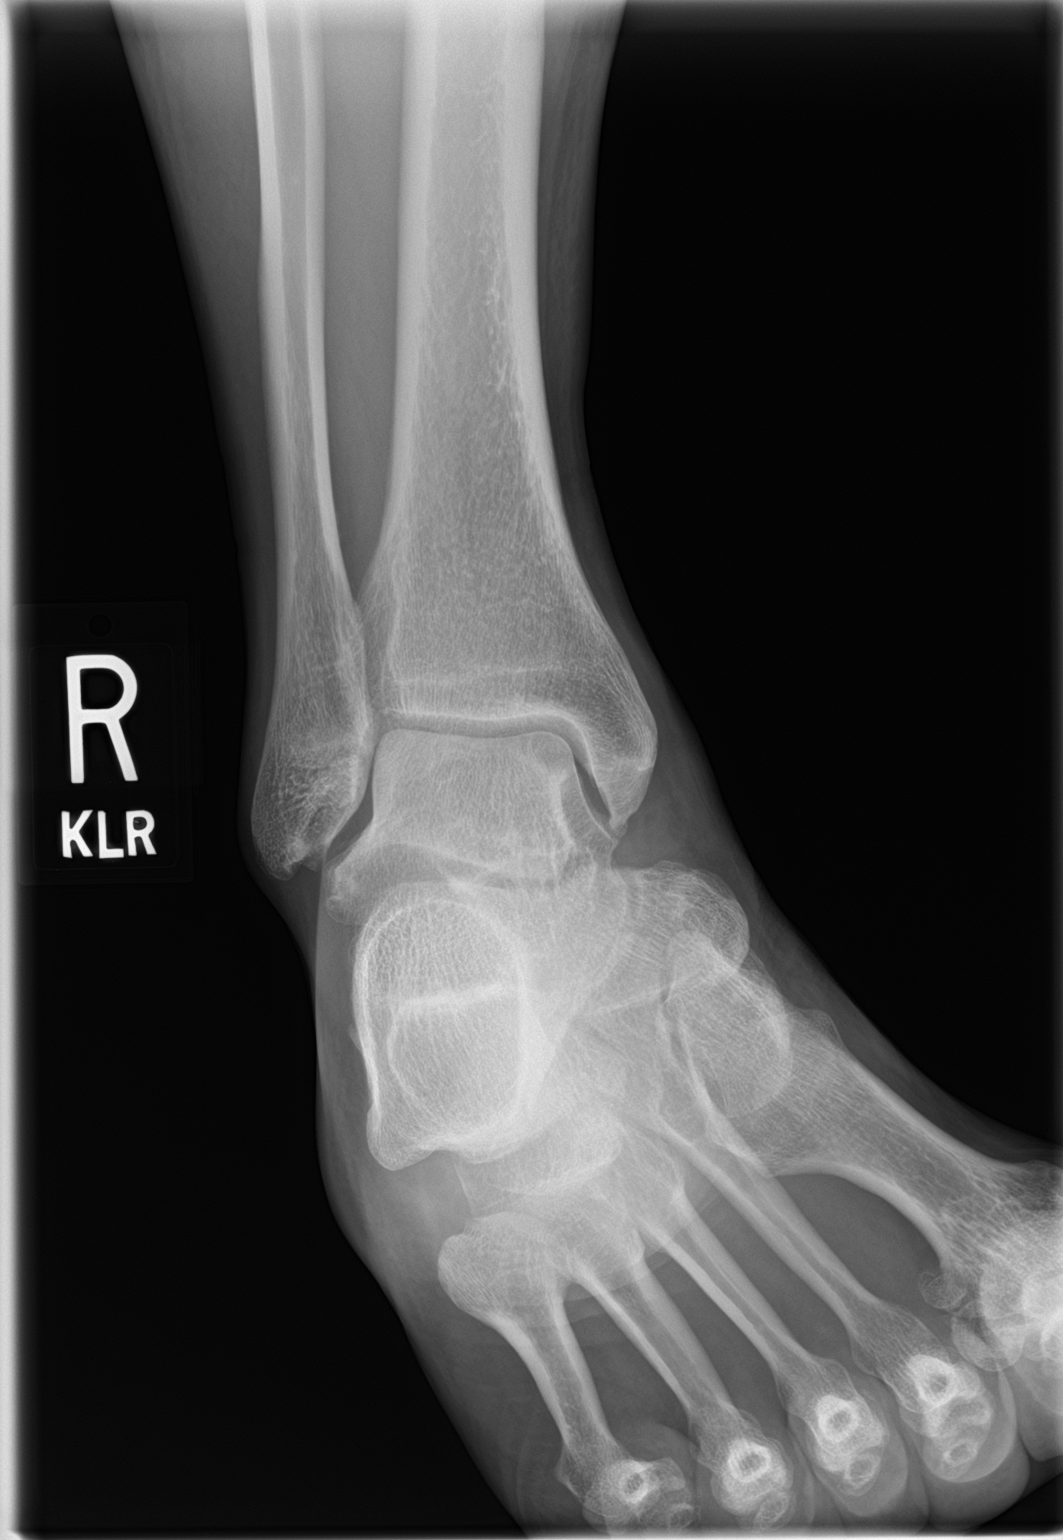
[im 3/3]
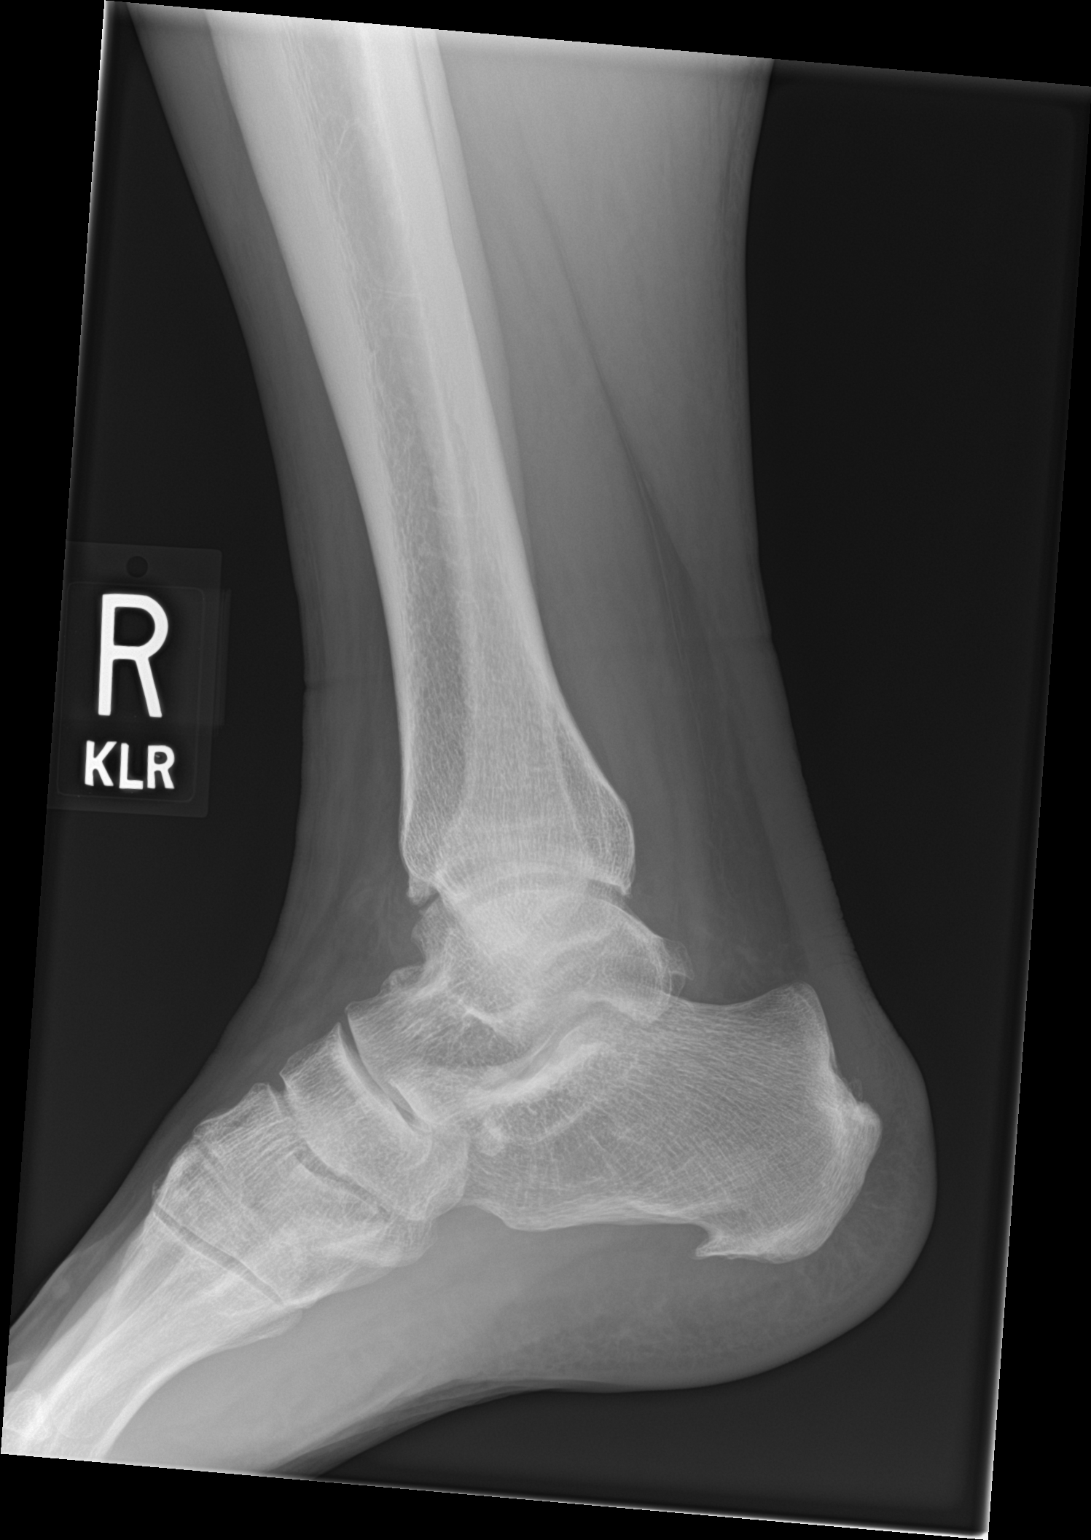

[3 of 3 positions shown; findings below may reference images not displayed]

FINDINGS: No evidence of acute, subacute or healed fractures. Ankle mortise
intact with well-preserved joint space. Mild spurring involving the
distal tibia posteriorly. Well-preserved bone mineral density. No
visible joint effusion. Small plantar calcaneal spur.
IMPRESSION: Minimal degenerative changes.

## 2020-10-30 DIAGNOSIS — F423 Hoarding disorder: Secondary | ICD-10-CM | POA: Insufficient documentation

## 2021-10-10 ENCOUNTER — Ambulatory Visit: Payer: Medicaid Other | Attending: Neurology

## 2021-10-10 DIAGNOSIS — G4733 Obstructive sleep apnea (adult) (pediatric): Secondary | ICD-10-CM | POA: Diagnosis present

## 2022-03-17 DIAGNOSIS — M1712 Unilateral primary osteoarthritis, left knee: Secondary | ICD-10-CM | POA: Insufficient documentation

## 2022-03-24 ENCOUNTER — Encounter: Payer: Self-pay | Admitting: Primary Care

## 2022-04-18 ENCOUNTER — Other Ambulatory Visit: Payer: Self-pay | Admitting: *Deleted

## 2022-04-18 DIAGNOSIS — Z122 Encounter for screening for malignant neoplasm of respiratory organs: Secondary | ICD-10-CM

## 2022-04-18 DIAGNOSIS — Z87891 Personal history of nicotine dependence: Secondary | ICD-10-CM

## 2022-05-13 ENCOUNTER — Ambulatory Visit (INDEPENDENT_AMBULATORY_CARE_PROVIDER_SITE_OTHER): Payer: Medicaid Other | Admitting: Primary Care

## 2022-05-13 ENCOUNTER — Encounter: Payer: Self-pay | Admitting: Primary Care

## 2022-05-13 ENCOUNTER — Telehealth: Payer: Self-pay | Admitting: Primary Care

## 2022-05-13 DIAGNOSIS — Z87891 Personal history of nicotine dependence: Secondary | ICD-10-CM | POA: Diagnosis not present

## 2022-05-13 NOTE — Telephone Encounter (Signed)
Can you please call patient with address for his LDCT scan that is scheduled for 12/20 at 4pm. Ok to leave leave a message.

## 2022-05-13 NOTE — Telephone Encounter (Signed)
Called and left detailed message for pt with address of [facility] CT location for his CT scheduled for 05/28/2022. Left my call back number if pt has any questions.

## 2022-05-13 NOTE — Progress Notes (Signed)
Virtual Visit via Telephone Note  I connected with Jason [name] on 05/13/22 at  1:30 PM EST by telephone and verified that I am speaking with the correct person using two identifiers.  Location: Patient: Home  Provider: Office    I discussed the limitations, risks, security and privacy concerns of performing an evaluation and management service by telephone and the availability of in person appointments. I also discussed with the patient that there may be a patient responsible charge related to this service. The patient expressed understanding and agreed to proceed.   Shared Decision Making Visit Lung Cancer Screening Program [phone])   Eligibility: Age 56 y.o. Pack Years Smoking History Calculation 37 (# packs/per year x # years smoked) Recent History of coughing up blood  no Unexplained weight loss? no ( >Than 15 pounds within the last 6 months ) Prior History Lung / other cancer no (Diagnosis within the last 5 years already requiring surveillance chest CT Scans). Smoking Status Former Smoker Former Smokers: Years since quit: 5 years  Quit Date: 2018  Visit Components: Discussion included one or more decision making aids. yes Discussion included risk/benefits of screening. yes Discussion included potential follow up diagnostic testing for abnormal scans. yes Discussion included meaning and risk of over diagnosis. yes Discussion included meaning and risk of False Positives. yes Discussion included meaning of total radiation exposure. yes  Counseling Included: Importance of adherence to annual lung cancer LDCT screening. yes Impact of comorbidities on ability to participate in the program. yes Ability and willingness to under diagnostic treatment. yes  Smoking Cessation Counseling: Current Smokers:  Discussed importance of smoking cessation. yes Information about tobacco cessation classes and interventions provided to patient. yes Patient provided with "ticket" for LDCT  Scan. Na Symptomatic Patient. no  Counseling(Intermediate counseling: > three minutes) 99406 Diagnosis Code: Tobacco Use Z72.0 Asymptomatic Patient yes  Counseling (Intermediate counseling: > three minutes counseling) [id number] Former Smokers:  Discussed the importance of maintaining cigarette abstinence. yes Diagnosis Code: Personal History of Nicotine Dependence. J18.841 Information about tobacco cessation classes and interventions provided to patient. Yes Patient provided with "ticket" for LDCT Scan. Na Written Order for Lung Cancer Screening with LDCT placed in Epic. Yes (CT Chest Lung Cancer Screening Low Dose W/O CM) [id number] Z12.2-Screening of respiratory organs Z87.891-Personal history of nicotine dependence   I have spent 25 minutes of face to face/ virtual visit   time with [name] discussing the risks and benefits of lung cancer screening. We viewed / discussed a power point together that explained in detail the above noted topics. We paused at intervals to allow for questions to be asked and answered to ensure understanding.We discussed that the single most powerful action that he can take to decrease his risk of developing lung cancer is to quit smoking. We discussed whether or not he is ready to commit to setting a quit date. We discussed options for tools to aid in quitting smoking including nicotine replacement therapy, non-nicotine medications, support groups, Quit Smart classes, and behavior modification. We discussed that often times setting smaller, more achievable goals, such as eliminating 1 cigarette a day for a week and then 2 cigarettes a day for a week can be helpful in slowly decreasing the number of cigarettes smoked. This allows for a sense of accomplishment as well as providing a clinical benefit. I provided  him with smoking cessation  information  with contact information for community resources, classes, free nicotine replacement therapy, and access to mobile apps, text  messaging, and on-line smoking cessation help. I have also provided him the office contact information in the event he needs to contact me, or the screening staff. We discussed the time and location of the scan, and that either Abigail Miyamoto RN, Karlton Lemon, RN  or I will call / send a letter with the results within 24-72 hours of receiving them. The patient verbalized understanding of all of  the above and had no further questions upon leaving the office. They have my contact information in the event they have any further questions.  I spent 3-5 minutes counseling on smoking cessation and the health risks of continued tobacco abuse.  I explained to the patient that there has been a high incidence of coronary artery disease noted on these exams. I explained that this is a non-gated exam therefore degree or severity cannot be determined. This patient is on statin therapy. I have asked the patient to follow-up with their PCP regarding any incidental finding of coronary artery disease and management with diet or medication as their PCP  feels is clinically indicated. The patient verbalized understanding of the above and had no further questions upon completion of the visit.    Glenford Bayley, NP

## 2022-05-13 NOTE — Patient Instructions (Signed)
Thank you for participating in the [facility]. It was our pleasure to meet you today. We will call you with the results of your scan within the next few days. Your scan will be assigned a Lung RADS category score by the physicians reading the scans.  This Lung RADS score determines follow up scanning.  See below for description of categories, and follow up screening recommendations. We will be in touch to schedule your follow up screening annually or based on recommendations of our providers. We will fax a copy of your scan results to your Primary Care Physician, or the physician who referred you to the program, to ensure they have the results. Please call the office if you have any questions or concerns regarding your scanning experience or results.  Our office number is [phone]. Please speak with Denise Phelps, RN. , or  Denise Buckner RN, They are  our Lung Cancer Screening RN.'s If They are unavailable when you call, Please leave a message on the voice mail. We will return your call at our earliest convenience.This voice mail is monitored several times a day.  Remember, if your scan is normal, we will scan you annually as long as you continue to meet the criteria for the program. (Age 55-77, Current smoker or smoker who has quit within the last 15 years). If you are a smoker, remember, quitting is the single most powerful action that you can take to decrease your risk of lung cancer and other pulmonary, breathing related problems. We know quitting is hard, and we are here to help.  Please let us know if there is anything we can do to help you meet your goal of quitting. If you are a former smoker, congratulations. We are proud of you! Remain smoke free! Remember you can refer friends or family members through the number above.  We will screen them to make sure they meet criteria for the program. Thank you for helping us take better care of you by  participating in Lung Screening.  You can receive free nicotine replacement therapy ( patches, gum or mints) by calling 1-800-QUIT NOW. Please call so we can get you on the path to becoming  a non-smoker. I know it is hard, but you can do this!  Lung RADS Categories:  Lung RADS 1: no nodules or definitely non-concerning nodules.  Recommendation is for a repeat annual scan in 12 months.  Lung RADS 2:  nodules that are non-concerning in appearance and behavior with a very low likelihood of becoming an active cancer. Recommendation is for a repeat annual scan in 12 months.  Lung RADS 3: nodules that are probably non-concerning , includes nodules with a low likelihood of becoming an active cancer.  Recommendation is for a 6-month repeat screening scan. Often noted after an upper respiratory illness. We will be in touch to make sure you have no questions, and to schedule your 6-month scan.  Lung RADS 4 A: nodules with concerning findings, recommendation is most often for a follow up scan in 3 months or additional testing based on our provider's assessment of the scan. We will be in touch to make sure you have no questions and to schedule the recommended 3 month follow up scan.  Lung RADS 4 B:  indicates findings that are concerning. We will be in touch with you to schedule additional diagnostic testing based on our provider's  assessment of the scan.  Other options for assistance in smoking cessation (   As covered by your insurance benefits)  Hypnosis for smoking cessation  [employer] [phone]  Acupuncture for smoking cessation  [facility] [phone]   

## 2022-05-26 ENCOUNTER — Telehealth: Payer: Self-pay

## 2022-05-26 NOTE — Telephone Encounter (Signed)
I believe Jason [name] told me before they can change the order without placing a new order.  Do we need anything else for the diagnosis codes?

## 2022-05-26 NOTE — Telephone Encounter (Signed)
I am not able to obtain an auth for a LCS CT for this patient.  Per Assunta Gambles w/ pt's insurance, this is something that is not covered & suggested a regular CT be placed.  Pt will be responsible for 100% of the CT if we proceed as a LCS CT.  Is it ok to change to a 71250 instead?

## 2022-05-28 ENCOUNTER — Ambulatory Visit
Admission: RE | Admit: 2022-05-28 | Discharge: 2022-05-28 | Disposition: A | Discharge status: Home / Self Care | Payer: Medicaid Other | Source: Ambulatory Visit | Attending: Acute Care | Admitting: Acute Care

## 2022-05-28 DIAGNOSIS — Z122 Encounter for screening for malignant neoplasm of respiratory organs: Secondary | ICD-10-CM | POA: Insufficient documentation

## 2022-05-28 DIAGNOSIS — Z87891 Personal history of nicotine dependence: Secondary | ICD-10-CM | POA: Insufficient documentation

## 2022-06-03 ENCOUNTER — Other Ambulatory Visit: Payer: Self-pay | Admitting: Acute Care

## 2022-06-03 DIAGNOSIS — Z122 Encounter for screening for malignant neoplasm of respiratory organs: Secondary | ICD-10-CM

## 2022-06-03 DIAGNOSIS — Z87891 Personal history of nicotine dependence: Secondary | ICD-10-CM

## 2022-09-29 ENCOUNTER — Ambulatory Visit (INDEPENDENT_AMBULATORY_CARE_PROVIDER_SITE_OTHER): Payer: Medicaid Other | Admitting: Internal Medicine

## 2022-09-29 VITALS — BP 130/80 | HR 76 | Resp 16 | Ht 68.0 in | Wt 225.0 lb

## 2022-09-29 DIAGNOSIS — I10 Essential (primary) hypertension: Secondary | ICD-10-CM | POA: Diagnosis not present

## 2022-09-29 DIAGNOSIS — G4733 Obstructive sleep apnea (adult) (pediatric): Secondary | ICD-10-CM

## 2022-09-29 NOTE — Patient Instructions (Signed)
Living With Sleep Apnea Sleep apnea is a condition in which breathing pauses or becomes shallow during sleep. Sleep apnea is most commonly caused by a collapsed or blocked airway. People with sleep apnea usually snore loudly. They may have times when they gasp and stop breathing for 10 seconds or more during sleep. This may happen many times during the night. The breaks in breathing also interrupt the deep sleep that you need to feel rested. Even if you do not completely wake up from the gaps in breathing, your sleep may not be restful and you feel tired during the day. You may also have a headache in the morning and low energy during the day, and you may feel anxious or depressed. How can sleep apnea affect me? Sleep apnea increases your chances of extreme tiredness during the day (daytime fatigue). It can also increase your risk for health conditions, such as: Heart attack. Stroke. Obesity. Type 2 diabetes. Heart failure. Irregular heartbeat. High blood pressure. If you have daytime fatigue as a result of sleep apnea, you may be more likely to: Perform poorly at school or work. Fall asleep while driving. Have difficulty with attention. Develop depression or anxiety. Have sexual dysfunction. What actions can I take to manage sleep apnea? Sleep apnea treatment  If you were given a device to open your airway while you sleep, use it only as told by your health care provider. You may be given: An oral appliance. This is a custom-made mouthpiece that shifts your lower jaw forward. A continuous positive airway pressure (CPAP) device. This device blows air through a mask when you breathe out (exhale). A nasal expiratory positive airway pressure (EPAP) device. This device has valves that you put into each nostril. A bi-level positive airway pressure (BIPAP) device. This device blows air through a mask when you breathe in (inhale) and breathe out (exhale). You may need surgery if other treatments  do not work for you. Sleep habits Go to sleep and wake up at the same time every day. This helps set your internal clock (circadian rhythm) for sleeping. If you stay up later than usual, such as on weekends, try to get up in the morning within 2 hours of your normal wake time. Try to get at least 7-9 hours of sleep each night. Stop using a computer, tablet, and mobile phone a few hours before bedtime. Do not take long naps during the day. If you nap, limit it to 30 minutes. Have a relaxing bedtime routine. Reading or listening to music may relax you and help you sleep. Use your bedroom only for sleep. Keep your television and computer out of your bedroom. Keep your bedroom cool, dark, and quiet. Use a supportive mattress and pillows. Follow your health care provider's instructions for other changes to sleep habits. Nutrition Do not eat heavy meals in the evening. Do not have caffeine in the later part of the day. The effects of caffeine can last for more than 5 hours. Follow your health care provider's or dietitian's instructions for any diet changes. Lifestyle     Do not drink alcohol before bedtime. Alcohol can cause you to fall asleep at first, but then it can cause you to wake up in the middle of the night and have trouble getting back to sleep. Do not use any products that contain nicotine or tobacco. These products include cigarettes, chewing tobacco, and vaping devices, such as e-cigarettes. If you need help quitting, ask your health care provider. Medicines Take   over-the-counter and prescription medicines only as told by your health care provider. Do not use over-the-counter sleep medicine. You can become dependent on this medicine, and it can make sleep apnea worse. Do not use medicines, such as sedatives and narcotics, unless told by your health care provider. Activity Exercise on most days, but avoid exercising in the evening. Exercising near bedtime can interfere with  sleeping. If possible, spend time outside every day. Natural light helps regulate your circadian rhythm. General information Lose weight if you need to, and maintain a healthy weight. Keep all follow-up visits. This is important. If you are having surgery, make sure to tell your health care provider that you have sleep apnea. You may need to bring your device with you. Where to find more information Learn more about sleep apnea and daytime fatigue from: American Sleep Association: sleepassociation.org National Sleep Foundation: sleepfoundation.org National Heart, Lung, and Blood Institute: nhlbi.nih.gov Summary Sleep apnea is a condition in which breathing pauses or becomes shallow during sleep. Sleep apnea can cause daytime fatigue and other serious health conditions. You may need to wear a device while sleeping to help keep your airway open. If you are having surgery, make sure to tell your health care provider that you have sleep apnea. You may need to bring your device with you. Making changes to sleep habits, diet, lifestyle, and activity can help you manage sleep apnea. This information is not intended to replace advice given to you by your health care provider. Make sure you discuss any questions you have with your health care provider. Document Revised: 01/02/2021 Document Reviewed: 05/04/2020 Elsevier Patient Education  2023 [employer]  

## 2022-09-29 NOTE — Progress Notes (Signed)
Sleep Medicine   Office Visit  Patient Name: Jason [name] DOB: [birth year] MRN [id number]    Chief Complaint: establish care/abnormal sleep study.   Brief History:  Jason [name] presents with a 1 year history of sleep apnea.  Sleep quality is fair. This is noted most nights. The patient's bed partner reports  loud snoring at night. The patient relates the following symptoms: loud snoring, excessive daytime sleepiness, and frequent awakenings are also present. The patient goes to sleep at 10:30 pm and wakes up at 7 am. Sleep quality is same when outside home environment.  Patient has noted restlessness of his legs at night.  The patient  relates no unusual behavior during the night.  The patient reports a history of psychiatric problems but reports this problem is in remission. The Epworth Sleepiness Score is 12 out of 24 .  The patient relates  Cardiovascular risk factors include: hypertension.    ROS  General: (-) fever, (-) chills, (-) night sweat Nose and Sinuses: (-) nasal stuffiness or itchiness, (-) postnasal drip, (-) nosebleeds, (-) sinus trouble. Mouth and Throat: (-) sore throat, (-) hoarseness. Neck: (-) swollen glands, (-) enlarged thyroid, (-) neck pain. Respiratory: - cough, - shortness of breath, - wheezing. Neurologic: - numbness, - tingling. Psychiatric: + anxiety, + depression Sleep behavior: -sleep paralysis -hypnogogic hallucinations -dream enactment      -vivid dreams -cataplexy -night terrors -sleep walking   Current Medication: Outpatient Encounter Medications as of 09/29/2022  Medication Sig   atorvastatin (LIPITOR) 40 MG tablet Take by mouth.   JANUMET 50-1000 MG tablet Take 1 tablet by mouth 2 (two) times daily.   lisinopril-hydrochlorothiazide (ZESTORETIC) 20-25 MG tablet Take 1 tablet by mouth daily.   meloxicam (MOBIC) 15 MG tablet Take 15 mg by mouth daily.   OZEMPIC, 0.25 OR 0.5 MG/DOSE, 2 MG/3ML SOPN Inject into the skin.   [DISCONTINUED] ibuprofen (ADVIL) 600  MG tablet Take 600 mg by mouth 3 (three) times daily.   albuterol (PROVENTIL HFA;VENTOLIN HFA) 108 (90 Base) MCG/ACT inhaler Inhale 2 puffs into the lungs every 4 (four) hours as needed for wheezing or shortness of breath (4-6 hours PRN).   metoprolol succinate (TOPROL-XL) 25 MG 24 hr tablet Take 25 mg by mouth 2 (two) times daily.    [DISCONTINUED] atorvastatin (LIPITOR) 80 MG tablet Take 80 mg by mouth daily.   [DISCONTINUED] buPROPion (ZYBAN) 150 MG 12 hr tablet Take 150 mg by mouth 2 (two) times daily. (Patient not taking: Reported on 05/13/2022)   [DISCONTINUED] citalopram (CELEXA) 40 MG tablet Take 40 mg by mouth daily.  (Patient not taking: Reported on 05/13/2022)   [DISCONTINUED] diclofenac (VOLTAREN) 75 MG EC tablet Take 1 tablet (75 mg total) by mouth 2 (two) times daily. (Patient not taking: Reported on 05/13/2022)   [DISCONTINUED] fluticasone (FLONASE) 50 MCG/ACT nasal spray Place 1 spray into both nostrils daily. (Patient not taking: Reported on 05/13/2022)   [DISCONTINUED] hydrochlorothiazide (HYDRODIURIL) 25 MG tablet Take 25 mg by mouth daily.   [DISCONTINUED] lisinopril (PRINIVIL,ZESTRIL) 20 MG tablet Take 20 mg by mouth every morning.   [DISCONTINUED] pregabalin (LYRICA) 25 MG capsule Take 1 capsule (25 mg total) by mouth 3 (three) times daily.   [DISCONTINUED] sildenafil (VIAGRA) 50 MG tablet Take 50 mg by mouth daily as needed for erectile dysfunction. (Patient not taking: Reported on 05/13/2022)   [DISCONTINUED] tadalafil (CIALIS) 5 MG tablet Take 5 mg by mouth daily as needed for erectile dysfunction. (Patient not taking: Reported on 05/13/2022)  No facility-administered encounter medications on file as of 09/29/2022.    Surgical History: Past Surgical History:  Procedure Laterality Date   COLONOSCOPY W/ BIOPSIES AND POLYPECTOMY     CYSTOSCOPY WITH HOLMIUM LASER LITHOTRIPSY Right 02/14/2015   Procedure: CYSTOSCOPY WITH HOLMIUM LASER LITHOTRIPSY;  Surgeon: Sebastian Ache, MD;   Location: [facility];  Service: Urology;  Laterality: Right;   CYSTOSCOPY WITH RETROGRADE PYELOGRAM, URETEROSCOPY AND STENT PLACEMENT Right 02/14/2015   Procedure: CYSTOSCOPY WITH RETROGRADE PYELOGRAM, URETEROSCOPY AND STENT PLACEMENT;  Surgeon: Sebastian Ache, MD;  Location: [facility];  Service: Urology;  Laterality: Right;   KIDNEY STONE SURGERY     NO PAST SURGERIES     STONE EXTRACTION WITH BASKET Right 02/14/2015   Procedure: STONE EXTRACTION WITH BASKET;  Surgeon: Sebastian Ache, MD;  Location: [facility];  Service: Urology;  Laterality: Right;    Medical History: Past Medical History:  Diagnosis Date   Anxiety    Arthritis    Chronic low back pain    Chronic pain 2002   Degenerative disc disease, lumbar    Depression    Diverticulosis of colon    History of hiatal hernia    Hypertension    Incomplete right bundle branch block    OSA on CPAP    Right nephrolithiasis    Urgency of urination    Wears partial dentures    UPPER AND LOWER    Family History: Non contributory to the present illness  Social History: Social History   Socioeconomic History   Marital status: Married    Spouse name: Not on file   Number of children: Not on file   Years of education: Not on file   Highest education level: Not on file  Occupational History   Not on file  Tobacco Use   Smoking status: Former    Packs/day: 1.25    Years: 38.00    Additional pack years: 0.00    Total pack years: 47.50    Types: Cigarettes    Quit date: 02/08/2019    Years since quitting: 3.6   Smokeless tobacco: Never  Vaping Use   Vaping Use: Never used  Substance and Sexual Activity   Alcohol use: No    Alcohol/week: 2.0 standard drinks of alcohol    Types: 2 Shots of liquor per week    Comment: Quit drinking about a year    Drug use: Yes    Types: Marijuana    Comment: Rarely uses marijuana   Sexual activity: Not on file  Other Topics Concern   Not on  file  Social History Narrative   Not on file   Social Determinants of Health   Financial Resource Strain: Not on file  Food Insecurity: Not on file  Transportation Needs: Not on file  Physical Activity: Not on file  Stress: Not on file  Social Connections: Not on file  Intimate Partner Violence: Not on file    Vital Signs: Blood pressure 130/80, pulse 76, resp. rate 16, height 5\' 8"  (1.727 m), weight 225 lb (102.1 kg), SpO2 96 %. Body mass index is 34.21 kg/m.   Examination: General Appearance: The patient is well-developed, well-nourished, and in no distress. Neck Circumference: 46 cm Skin: Gross inspection of skin unremarkable. Head: normocephalic, no gross deformities. Eyes: no gross deformities noted. ENT: ears appear grossly normal Neurologic: Alert and oriented. No involuntary movements.    STOP BANG RISK ASSESSMENT S (snore) Have you been told that you  snore?     YES   T (tired) Are you often tired, fatigued, or sleepy during the day?   YES  O (obstruction) Do you stop breathing, choke, or gasp during sleep? NO   P (pressure) Do you have or are you being treated for high blood pressure? YES   B (BMI) Is your body index greater than 35 kg/m? NO   A (age) Are you 46 years old or older? YES   N (neck) Do you have a neck circumference greater than 16 inches?   YES   G (gender) Are you a male? YES   TOTAL STOP/BANG "YES" ANSWERS 6                                                               A STOP-Bang score of 2 or less is considered low risk, and a score of 5 or more is high risk for having either moderate or severe OSA. For people who score 3 or 4, doctors may need to perform further assessment to determine how likely they are to have OSA.         EPWORTH SLEEPINESS SCALE:  Scale:  (0)= no chance of dozing; (1)= slight chance of dozing; (2)= moderate chance of dozing; (3)= high chance of dozing  Chance  Situtation    Sitting and reading: 1     Watching TV: 1    Sitting Inactive in public: 1    As a passenger in car: 3      Lying down to rest: 3    Sitting and talking: 1    Sitting quielty after lunch: 1    In a car, stopped in traffic: 1   TOTAL SCORE:   12 out of 24    SLEEP STUDIES:  PSG (10/10/21) AHI 20.8, supine AHI 87.4, min SPO2 86%   LABS: No results found for this or any previous visit (from the past 2160 hour(s)).  Radiology: CT CHEST LUNG CA SCREEN LOW DOSE W/O CM  Result Date: 06/01/2022 CLINICAL DATA:  Former smoker with 37 pack-year history EXAM: CT CHEST WITHOUT CONTRAST LOW-DOSE FOR LUNG CANCER SCREENING TECHNIQUE: Multidetector CT imaging of the chest was performed following the standard protocol without IV contrast. RADIATION DOSE REDUCTION: This exam was performed according to the departmental dose-optimization program which includes automated exposure control, adjustment of the mA and/or kV according to patient size and/or use of iterative reconstruction technique. COMPARISON:  None Available. FINDINGS: Cardiovascular: Normal heart size. No pericardial effusion. Normal caliber thoracic aorta with mild calcified plaque. Mild coronary artery calcifications. Mediastinum/Nodes: Esophagus and thyroid are unremarkable. No pathologically enlarged lymph nodes seen in the chest. Lungs/Pleura: Central airways are patent. No consolidation, pleural effusion or pneumothorax. Small solid pulmonary nodule of the right lower lobe measuring 3.9 mm in mean diameter on image 219. Upper Abdomen: Low-attenuation lesions of the right lobe of the liver which are likely simple cysts. No acute abnormality. Musculoskeletal: No chest wall mass or suspicious bone lesions identified. IMPRESSION: 1. Lung-RADS 2S, benign appearance or behavior. Continue annual screening with low-dose chest CT without contrast in 12 months. S modifier for coronary artery calcifications. 2. Mild coronary artery calcifications, recommend ASCVD risk  assessment. 3. Aortic Atherosclerosis (ICD10-I70.0). Electronically Signed   By: Allegra Lai  M.D.   On: 06/01/2022 22:04   No results found.  No results found.    Assessment and Plan: Patient Active Problem List   Diagnosis Date Noted   Primary osteoarthritis of left knee 03/17/2022   Hoarding behavior 10/30/2020   Mild cognitive impairment 12/09/2019   Diabetes 05/23/2019   Chronic pain syndrome 01/18/2018   Chronic pain of right ankle 01/18/2018   Anxiety 04/11/2017   Insomnia 04/11/2017   OSA (obstructive sleep apnea) 12/20/2014   Fatigue 09/26/2014   Left knee pain 10/03/2013   Musculoskeletal back pain 09/08/2013   Depression 09/08/2013   HTN (hypertension) 09/08/2013   1. OSA (obstructive sleep apnea) PLAN OSA:   Patient evaluation suggests high risk of sleep disordered breathing due to history of OSA identified on PSG 10/10/2021. He has snoring, gasping, daytime sleepiness and frequent awakenings.  Patient has comorbid cardiovascular risk factors including: hypertension which could be exacerbated by pathologic sleep-disordered breathing.  Suggest: split study  to assess/treat the patient's sleep disordered breathing. The patient was also counselled on weight loss to optimize sleep health.  2. Hypertension, unspecified type Hypertension Counseling:   The following hypertensive lifestyle modification were recommended and discussed:  1. Limiting alcohol intake to less than 1 oz/day of ethanol:(24 oz of beer or 8 oz of wine or 2 oz of 100-proof whiskey). 2. Take baby ASA 81 mg daily. 3. Importance of regular aerobic exercise and losing weight. 4. Reduce dietary saturated fat and cholesterol intake for overall cardiovascular health. 5. Maintaining adequate dietary potassium, calcium, and magnesium intake. 6. Regular monitoring of the blood pressure. 7. Reduce sodium intake to less than 100 mmol/day (less than 2.3 gm of sodium or less than 6 gm of sodium choride)        General Counseling: I have discussed the findings of the evaluation and examination with [place].  I have also discussed any further diagnostic evaluation thatmay be needed or ordered today. Foye verbalizes understanding of the findings of todays visit. We also reviewed his medications today and discussed drug interactions and side effects including but not limited excessive drowsiness and altered mental states. We also discussed that there is always a risk not just to him but also people around him. he has been encouraged to call the office with any questions or concerns that should arise related to todays visit.  No orders of the defined types were placed in this encounter.       I have personally obtained a history, evaluated the patient, evaluated pertinent data, formulated the assessment and plan and placed orders. This patient was seen today by Emmaline Kluver, PA-C in collaboration with Dr. Freda Munro.     Yevonne Pax, MD [facility] Diplomate ABMS Pulmonary and Critical Care Medicine Sleep medicine

## 2022-09-30 ENCOUNTER — Telehealth: Payer: Self-pay

## 2022-09-30 ENCOUNTER — Other Ambulatory Visit: Payer: Self-pay

## 2022-09-30 DIAGNOSIS — Z86010 Personal history of colonic polyps: Secondary | ICD-10-CM

## 2022-09-30 MED ORDER — NA SULFATE-K SULFATE-MG SULF 17.5-3.13-1.6 GM/177ML PO SOLN: 1.0000 | Freq: Once | ORAL | 0 refills | Status: AC

## 2022-09-30 NOTE — Telephone Encounter (Signed)
Gastroenterology Pre-Procedure Review  Request Date: 01/05/23 Requesting Physician: Dr. Allegra Lai  PATIENT REVIEW QUESTIONS: The patient responded to the following health history questions as indicated:    1. Are you having any GI issues? no 2. Do you have a personal history of Polyps? yes (last colonoscopy performed at [facility] 01/02/20 with Dr. Alfred Levins) 3. Do you have a family history of Colon Cancer or Polyps? no 4. Diabetes Mellitus? yes (takes Janumet advised to stop 3 days prior to colonoscopy.  Ozempic stop 7 days prior to colonoscopy.) 5. Joint replacements in the past 12 months?no 6. Major health problems in the past 3 months?no 7. Any artificial heart valves, MVP, or defibrillator?no    MEDICATIONS & ALLERGIES:    Patient reports the following regarding taking any anticoagulation/antiplatelet therapy:   Plavix, Coumadin, Eliquis, Xarelto, Lovenox, Pradaxa, Brilinta, or Effient? no Aspirin? no  Patient confirms/reports the following medications:  Current Outpatient Medications  Medication Sig Dispense Refill   albuterol (PROVENTIL HFA;VENTOLIN HFA) 108 (90 Base) MCG/ACT inhaler Inhale 2 puffs into the lungs every 4 (four) hours as needed for wheezing or shortness of breath (4-6 hours PRN).     atorvastatin (LIPITOR) 40 MG tablet Take by mouth.     JANUMET 50-1000 MG tablet Take 1 tablet by mouth 2 (two) times daily.     lisinopril-hydrochlorothiazide (ZESTORETIC) 20-25 MG tablet Take 1 tablet by mouth daily.     meloxicam (MOBIC) 15 MG tablet Take 15 mg by mouth daily.     metoprolol succinate (TOPROL-XL) 25 MG 24 hr tablet Take 25 mg by mouth 2 (two) times daily.      OZEMPIC, 0.25 OR 0.5 MG/DOSE, 2 MG/3ML SOPN Inject into the skin.     No current facility-administered medications for this visit.    Patient confirms/reports the following allergies:  No Known Allergies  No orders of the defined types were placed in this encounter.   AUTHORIZATION  INFORMATION Primary Insurance: 1D#: Group #:  Secondary Insurance: 1D#: Group #:  SCHEDULE INFORMATION: Date: 01/05/23 Time: Location: [facility]

## 2022-11-14 ENCOUNTER — Ambulatory Visit
Admission: EM | Admit: 2022-11-14 | Discharge: 2022-11-14 | Disposition: A | Discharge status: Home / Self Care | Payer: Medicaid Other

## 2022-11-14 DIAGNOSIS — S30861A Insect bite (nonvenomous) of abdominal wall, initial encounter: Secondary | ICD-10-CM

## 2022-11-14 DIAGNOSIS — L03314 Cellulitis of groin: Secondary | ICD-10-CM

## 2022-11-14 DIAGNOSIS — W57XXXA Bitten or stung by nonvenomous insect and other nonvenomous arthropods, initial encounter: Secondary | ICD-10-CM | POA: Diagnosis not present

## 2022-11-14 NOTE — ED Provider Notes (Signed)
Renaldo Fiddler    CSN: [id number] Arrival date & time: 11/14/22  1917      History   Chief Complaint Chief Complaint  Patient presents with   Insect Bite    HPI LILLIAN BENNE is a 57 y.o. male.   Welling to pelvis for 2 days, reomvode tick, and shaved area, no treatment   Past Medical History:  Diagnosis Date   Anxiety    Arthritis    Chronic low back pain    Chronic pain 2002   Degenerative disc disease, lumbar    Depression    Diverticulosis of colon    History of hiatal hernia    Hypertension    Incomplete right bundle branch block    OSA on CPAP    Right nephrolithiasis    Urgency of urination    Wears partial dentures    UPPER AND LOWER    Patient Active Problem List   Diagnosis Date Noted   Primary osteoarthritis of left knee 03/17/2022   Hoarding behavior 10/30/2020   Mild cognitive impairment 12/09/2019   Diabetes (HCC) 05/23/2019   Chronic pain syndrome 01/18/2018   Chronic pain of right ankle 01/18/2018   Anxiety 04/11/2017   Insomnia 04/11/2017   OSA (obstructive sleep apnea) 12/20/2014   Fatigue 09/26/2014   Left knee pain 10/03/2013   Musculoskeletal back pain 09/08/2013   Depression 09/08/2013   HTN (hypertension) 09/08/2013    Past Surgical History:  Procedure Laterality Date   COLONOSCOPY W/ BIOPSIES AND POLYPECTOMY     CYSTOSCOPY WITH HOLMIUM LASER LITHOTRIPSY Right 02/14/2015   Procedure: CYSTOSCOPY WITH HOLMIUM LASER LITHOTRIPSY;  Surgeon: Sebastian Ache, MD;  Location: [facility];  Service: Urology;  Laterality: Right;   CYSTOSCOPY WITH RETROGRADE PYELOGRAM, URETEROSCOPY AND STENT PLACEMENT Right 02/14/2015   Procedure: CYSTOSCOPY WITH RETROGRADE PYELOGRAM, URETEROSCOPY AND STENT PLACEMENT;  Surgeon: Sebastian Ache, MD;  Location: [facility];  Service: Urology;  Laterality: Right;   KIDNEY STONE SURGERY     NO PAST SURGERIES     STONE EXTRACTION WITH BASKET Right 02/14/2015   Procedure: STONE  EXTRACTION WITH BASKET;  Surgeon: Sebastian Ache, MD;  Location: [facility];  Service: Urology;  Laterality: Right;       Home Medications    Prior to Admission medications   Medication Sig Start Date End Date Taking? Authorizing Provider  albuterol (PROVENTIL HFA;VENTOLIN HFA) 108 (90 Base) MCG/ACT inhaler Inhale 2 puffs into the lungs every 4 (four) hours as needed for wheezing or shortness of breath (4-6 hours PRN).    [provider]  atorvastatin (LIPITOR) 40 MG tablet Take by mouth. 06/25/22   [provider]  JANUMET 50-1000 MG tablet Take 1 tablet by mouth 2 (two) times daily. 06/19/22   [provider]  lisinopril-hydrochlorothiazide (ZESTORETIC) 20-25 MG tablet Take 1 tablet by mouth daily. 06/19/22   [provider]  meloxicam (MOBIC) 15 MG tablet Take 15 mg by mouth daily. 07/30/22   [provider]  metoprolol succinate (TOPROL-XL) 25 MG 24 hr tablet Take 25 mg by mouth 2 (two) times daily.     [provider]  OZEMPIC, 0.25 OR 0.5 MG/DOSE, 2 MG/3ML SOPN Inject into the skin. 07/31/22   [provider]    Family History Family History  Problem Relation Age of Onset   Hypertension Mother    Cancer Mother    Hypertension Father    Dementia Father     Social History  Social History   Tobacco Use   Smoking status: Former    Packs/day: 1.25    Years: 38.00    Additional pack years: 0.00    Total pack years: 47.50    Types: Cigarettes    Quit date: 02/08/2019    Years since quitting: 3.7   Smokeless tobacco: Never  Vaping Use   Vaping Use: Never used  Substance Use Topics   Alcohol use: No    Alcohol/week: 2.0 standard drinks of alcohol    Types: 2 Shots of liquor per week    Comment: Quit drinking about a year    Drug use: Yes    Types: Marijuana    Comment: Rarely uses marijuana     Allergies   Patient has no known allergies.   Review of Systems Review of Systems   Physical  Exam Triage Vital Signs ED Triage Vitals  Enc Vitals Group     BP 11/14/22 1936 136/87     Pulse Rate 11/14/22 1936 86     Resp 11/14/22 1936 18     Temp 11/14/22 1936 98.3 F (36.8 C)     Temp Source 11/14/22 1936 Oral     SpO2 11/14/22 1936 93 %     Weight --      Height --      Head Circumference --      Peak Flow --      Pain Score 11/14/22 1922 0     Pain Loc --      Pain Edu? --      Excl. in GC? --    No data found.  Updated Vital Signs BP 136/87 (BP Location: Left Arm)   Pulse 86   Temp 98.3 F (36.8 C) (Oral)   Resp 18   SpO2 93%   Visual Acuity Right Eye Distance:   Left Eye Distance:   Bilateral Distance:    Right Eye Near:   Left Eye Near:    Bilateral Near:     Physical Exam   UC Treatments / Results  Labs (all labs ordered are listed, but only abnormal results are displayed) Labs Reviewed - No data to display  EKG   Radiology No results found.  Procedures Procedures (including critical care time)  Medications Ordered in UC Medications - No data to display  Initial Impression / Assessment and Plan / UC Course  I have reviewed the triage vital signs and the nursing notes.  Pertinent labs & imaging results that were available during my care of the patient were reviewed by me and considered in my medical decision making (see chart for details).     *** Final Clinical Impressions(s) / UC Diagnoses   Final diagnoses:  Tick bite of groin, initial encounter  Cellulitis of groin     Discharge Instructions      Your evaluated for your tick bite, I do not see any remaining parts of the body and I do believe that you got it out in its entirety, based on the presentation you do not have signs consistent with Lyme disease which presents as a bull's-eye rash, Lyme disease is also low risk here in [place]  There is significant swelling to your pelvic region and therefore I will start you on antibiotic for infection  Take  doxycycline every morning and every evening for 7 days  You may help warm compresses to the affected area to help reduce swelling and for general comfort  For itching you may  apply topical hydrocortisone or Benadryl cream which you may find at most stores  For any concerns regarding healing you may follow-up with his urgent care for reevaluation   ED Prescriptions   None    PDMP not reviewed this encounter.

## 2022-11-14 NOTE — ED Triage Notes (Signed)
Patient presents to UC tick bite to right groin x 1 day. State it is red and swollen. Concerned with infection.

## 2022-11-14 NOTE — Discharge Instructions (Signed)
Your evaluated for your tick bite, I do not see any remaining parts of the body and I do believe that you got it out in its entirety, based on the presentation you do not have signs consistent with Lyme disease which presents as a bull's-eye rash, Lyme disease is also low risk here in [place]  There is significant swelling to your pelvic region and therefore I will start you on antibiotic for infection  Take doxycycline every morning and every evening for 7 days  You may help warm compresses to the affected area to help reduce swelling and for general comfort  For itching you may apply topical hydrocortisone or Benadryl cream which you may find at most stores  For any concerns regarding healing you may follow-up with his urgent care for reevaluation

## 2022-11-15 ENCOUNTER — Telehealth: Payer: Self-pay | Admitting: Urgent Care

## 2022-11-15 ENCOUNTER — Telehealth (HOSPITAL_COMMUNITY): Payer: Self-pay | Admitting: Emergency Medicine

## 2022-11-15 DIAGNOSIS — W57XXXA Bitten or stung by nonvenomous insect and other nonvenomous arthropods, initial encounter: Secondary | ICD-10-CM

## 2022-11-15 MED ORDER — DOXYCYCLINE HYCLATE 100 MG PO CAPS
100.0000 mg | ORAL_CAPSULE | Freq: Two times a day (BID) | ORAL | 0 refills | Status: DC
Start: 2022-11-15 — End: 2023-11-26

## 2022-11-15 MED ORDER — DOXYCYCLINE HYCLATE 100 MG PO CAPS: 100.0000 mg | ORAL_CAPSULE | Freq: Two times a day (BID) | ORAL | 0 refills | Status: DC

## 2022-11-15 NOTE — Telephone Encounter (Signed)
Medication not sent at time of visit

## 2022-11-15 NOTE — Telephone Encounter (Signed)
Pharmacy contacted clinic to follow-up on a visit this patient had yesterday where he was seen for presumed tick bite.  Providers note indicates intention to prescribe doxycycline twice daily x 7 days but failed to send the prescription.  Pharmacy requesting the medication based on patient inquiring.

## 2022-12-30 ENCOUNTER — Telehealth: Payer: Self-pay | Admitting: Gastroenterology

## 2022-12-30 NOTE — Telephone Encounter (Signed)
Patient called in because he needs to reschedule his procedure for Monday.

## 2022-12-31 ENCOUNTER — Telehealth: Payer: Self-pay

## 2022-12-31 NOTE — Telephone Encounter (Signed)
Phone call has been returned to patient to reschedule his 01/05/23 colonoscopy with Dr. Allegra Lai at [facility] Rescheduled due to transportation. Colonoscopy has been rescheduled to 01/07/23.  Verlon Au in Endo has been informed of date change.  Instructions updated in mychart.  Thanks, [place], [place]

## 2023-01-06 ENCOUNTER — Telehealth: Payer: Self-pay

## 2023-01-06 NOTE — Telephone Encounter (Signed)
Patient called in to change his appointment because she ate a lot of peanuts.

## 2023-01-06 NOTE — Telephone Encounter (Signed)
Colonoscopy reschedule request has been received today for patients colonoscopy tomorrow with Dr. Allegra Lai.  Returned his call.  Left voice message for him to call back.  Thanks, [place], [place]

## 2023-01-07 NOTE — Telephone Encounter (Signed)
Jason [name] in Endo has been asked to reschedule patient to 02/19/23 with Dr. Allegra Lai.  Pt stated that he did not receive instructions.  I informed him that instructions were located in his mychart and mailed.  Address confirmed.  He also ate pistachios.

## 2023-02-02 ENCOUNTER — Telehealth: Payer: Self-pay | Admitting: Gastroenterology

## 2023-02-02 NOTE — Telephone Encounter (Signed)
The patient called in to reschedule colonoscopy. Patient stated that he prefers it to be 2 weeks and 1 day pass the original date and would be perfect if he can have it done on a Thursday.

## 2023-02-16 ENCOUNTER — Telehealth: Payer: Self-pay

## 2023-02-16 NOTE — Telephone Encounter (Signed)
PT called to cancel procedure for 02/19/2023  having transpotation problems will call back to reschedule

## 2023-02-19 ENCOUNTER — Encounter: Admission: RE | Payer: Self-pay | Source: Home / Self Care

## 2023-02-19 ENCOUNTER — Ambulatory Visit: Admission: RE | Admit: 2023-02-19 | Payer: Medicaid Other | Source: Home / Self Care | Admitting: Gastroenterology

## 2023-02-19 SURGERY — COLONOSCOPY WITH PROPOFOL
Anesthesia: General

## 2023-06-01 ENCOUNTER — Ambulatory Visit: Payer: Medicaid Other

## 2023-06-01 ENCOUNTER — Ambulatory Visit
Admission: RE | Admit: 2023-06-01 | Discharge: 2023-06-01 | Disposition: A | Discharge status: Home / Self Care | Payer: Medicaid Other | Source: Ambulatory Visit | Attending: Acute Care | Admitting: Acute Care

## 2023-06-01 DIAGNOSIS — Z87891 Personal history of nicotine dependence: Secondary | ICD-10-CM | POA: Insufficient documentation

## 2023-06-01 DIAGNOSIS — Z122 Encounter for screening for malignant neoplasm of respiratory organs: Secondary | ICD-10-CM | POA: Insufficient documentation

## 2023-06-16 ENCOUNTER — Other Ambulatory Visit: Payer: Self-pay

## 2023-06-16 DIAGNOSIS — Z122 Encounter for screening for malignant neoplasm of respiratory organs: Secondary | ICD-10-CM

## 2023-06-16 DIAGNOSIS — Z87891 Personal history of nicotine dependence: Secondary | ICD-10-CM

## 2023-09-19 NOTE — Progress Notes (Signed)
 [facility] [address] [place], [place] [address]  Pulmonary Sleep Medicine   Office Visit Note  Patient Name: Jason [name] DOB: 1966-04-21 MRN [id number]    Chief Complaint: Obstructive Sleep Apnea visit  Brief History:  Jason [name] is seen today for follow up after setup on CPAP @ 8cmH20. The patient has a 2 year history of sleep apnea. Patient is not using PAP nightly.  The patient feels rested after sleeping with PAP.  The patient reports benefiting from PAP use. Reported sleepiness is  improved and the Epworth Sleepiness Score is 3 out of 24. The patient does not take naps. The patient complains of the following: pt had a major sinus infection that prevented him from using the PAP machine. The compliance download shows 37% compliance with an average use time of 1 hour and 51 minutes. The AHI is 1.6  The patient does complain of limb movements disrupting sleep.  ROS  General: (-) fever, (-) chills, (-) night sweat Nose and Sinuses: (-) nasal stuffiness or itchiness, (-) postnasal drip, (-) nosebleeds, (-) sinus trouble. Mouth and Throat: (-) sore throat, (-) hoarseness. Neck: (-) swollen glands, (-) enlarged thyroid, (-) neck pain. Respiratory: - cough, - shortness of breath, - wheezing. Neurologic: - numbness, - tingling. Psychiatric: + anxiety, - depression   Current Medication: Outpatient Encounter Medications as of 09/21/2023  Medication Sig   albuterol (PROVENTIL HFA;VENTOLIN HFA) 108 (90 Base) MCG/ACT inhaler Inhale 2 puffs into the lungs every 4 (four) hours as needed for wheezing or shortness of breath (4-6 hours PRN).   atorvastatin (LIPITOR) 40 MG tablet Take by mouth.   doxycycline (VIBRAMYCIN) 100 MG capsule Take 1 capsule (100 mg total) by mouth 2 (two) times daily.   doxycycline (VIBRAMYCIN) 100 MG capsule Take 1 capsule (100 mg total) by mouth 2 (two) times daily.   JANUMET 50-1000 MG tablet Take 1 tablet by mouth 2 (two) times daily.    lisinopril-hydrochlorothiazide (ZESTORETIC) 20-25 MG tablet Take 1 tablet by mouth daily.   meloxicam (MOBIC) 15 MG tablet Take 15 mg by mouth daily.   metoprolol succinate (TOPROL-XL) 25 MG 24 hr tablet Take 25 mg by mouth 2 (two) times daily.    OZEMPIC, 0.25 OR 0.5 MG/DOSE, 2 MG/3ML SOPN Inject into the skin.   No facility-administered encounter medications on file as of 09/21/2023.    Surgical History: Past Surgical History:  Procedure Laterality Date   COLONOSCOPY W/ BIOPSIES AND POLYPECTOMY     CYSTOSCOPY WITH HOLMIUM LASER LITHOTRIPSY Right 02/14/2015   Procedure: CYSTOSCOPY WITH HOLMIUM LASER LITHOTRIPSY;  Surgeon: Osborn Blaze, MD;  Location: [facility];  Service: Urology;  Laterality: Right;   CYSTOSCOPY WITH RETROGRADE PYELOGRAM, URETEROSCOPY AND STENT PLACEMENT Right 02/14/2015   Procedure: CYSTOSCOPY WITH RETROGRADE PYELOGRAM, URETEROSCOPY AND STENT PLACEMENT;  Surgeon: Osborn Blaze, MD;  Location: [facility];  Service: Urology;  Laterality: Right;   KIDNEY STONE SURGERY     NO PAST SURGERIES     STONE EXTRACTION WITH BASKET Right 02/14/2015   Procedure: STONE EXTRACTION WITH BASKET;  Surgeon: Osborn Blaze, MD;  Location: [facility];  Service: Urology;  Laterality: Right;    Medical History: Past Medical History:  Diagnosis Date   Anxiety    Arthritis    Chronic low back pain    Chronic pain 2002   Degenerative disc disease, lumbar    Depression    Diverticulosis of colon    History of hiatal hernia  Hypertension    Incomplete right bundle branch block    OSA on CPAP    Right nephrolithiasis    Urgency of urination    Wears partial dentures    UPPER AND LOWER    Family History: Non contributory to the present illness  Social History: Social History   Socioeconomic History   Marital status: Married    Spouse name: Not on file   Number of children: Not on file   Years of education: Not on file   Highest  education level: Not on file  Occupational History   Not on file  Tobacco Use   Smoking status: Former    Current packs/day: 0.00    Average packs/day: 1.3 packs/day for 38.0 years (47.5 ttl pk-yrs)    Types: Cigarettes    Start date: 02/07/1981    Quit date: 02/08/2019    Years since quitting: 4.6   Smokeless tobacco: Never  Vaping Use   Vaping status: Never Used  Substance and Sexual Activity   Alcohol use: No    Alcohol/week: 2.0 standard drinks of alcohol    Types: 2 Shots of liquor per week    Comment: Quit drinking about a year    Drug use: Yes    Types: Marijuana    Comment: Rarely uses marijuana   Sexual activity: Not on file  Other Topics Concern   Not on file  Social History Narrative   Not on file   Social Drivers of Health   Financial Resource Strain: High Risk (06/23/2019)   Received from [facility]   Overall Financial Resource Strain (CARDIA)    Difficulty of Paying Living Expenses: Very hard  Food Insecurity: Food Insecurity Present (06/23/2019)   Received from [facility], [facility]   Hunger Vital Sign    Worried About Running Out of Food in the Last Year: Sometimes true    Ran Out of Food in the Last Year: Sometimes true  Transportation Needs: Not on file  Physical Activity: Not on file  Stress: Not on file  Social Connections: Not on file  Intimate Partner Violence: Not on file    Vital Signs: Blood pressure 131/82, pulse 93, resp. rate 16, height 5\' 8"  (1.727 m), weight 219 lb (99.3 kg), SpO2 96%. Body mass index is 33.3 kg/m.    Examination: General Appearance: The patient is well-developed, well-nourished, and in no distress. Neck Circumference: 46 cm Skin: Gross inspection of skin unremarkable. Head: normocephalic, no gross deformities. Eyes: no gross deformities noted. ENT: ears appear grossly normal Neurologic: Alert and oriented. No involuntary movements.  STOP BANG RISK ASSESSMENT S (snore) Have you been  told that you snore?     YES   T (tired) Are you often tired, fatigued, or sleepy during the day?   NO  O (obstruction) Do you stop breathing, choke, or gasp during sleep? NO   P (pressure) Do you have or are you being treated for high blood pressure? YES   B (BMI) Is your body index greater than 35 kg/m? NO   A (age) Are you 74 years old or older? YES   N (neck) Do you have a neck circumference greater than 16 inches?   NO   G (gender) Are you a male? YES   TOTAL STOP/BANG "YES" ANSWERS 4       A STOP-Bang score of 2 or less is considered low risk, and a score of 5 or more is high  risk for having either moderate or severe OSA. For people who score 3 or 4, doctors may need to perform further assessment to determine how likely they are to have OSA.         EPWORTH SLEEPINESS SCALE:  Scale:  (0)= no chance of dozing; (1)= slight chance of dozing; (2)= moderate chance of dozing; (3)= high chance of dozing  Chance  Situtation    Sitting and reading: 0    Watching TV: 1    Sitting Inactive in public: 0    As a passenger in car: 1      Lying down to rest: 1    Sitting and talking: 0    Sitting quielty after lunch: 0    In a car, stopped in traffic: 0   TOTAL SCORE:   3 out of 24    SLEEP STUDIES:  PSG - 10/10/21 - AHI 20.8/hr, Supine AHI 87.4/hr, min Sp02 86% PSG - 12/06/22 - AHI 17.5/hr, Supine AHI 131/hr, min Sp02 85% Split - 05/13/2023 - AHI 51.2./hr, REM 23/hr, Supine AHI 53.9/hr, min Sp02 87% - CPAP @ 8cmH20   CPAP COMPLIANCE DATA:  Date Range: 07/09/2023 - 09/17/2023  Average Daily Use: 4 hours 24 minutes  Median Use: 4 hours 18 minutes  Compliance for > 4 Hours: 37% days  AHI: 1.6 respiratory events per hour  Days Used: 30/71  Mask Leak: 72.3  95th Percentile Pressure: 8cmH20         LABS: No results found for this or any previous visit (from the past 2160 hours).  Radiology: CT CHEST LUNG CA SCREEN LOW DOSE W/O CM Result Date:  06/15/2023 CLINICAL DATA:  58 year old male former smoker with 37 pack-year smoking history, quit smoking 2018 EXAM: CT CHEST WITHOUT CONTRAST LOW-DOSE FOR LUNG CANCER SCREENING TECHNIQUE: Multidetector CT imaging of the chest was performed following the standard protocol without IV contrast. RADIATION DOSE REDUCTION: This exam was performed according to the departmental dose-optimization program which includes automated exposure control, adjustment of the mA and/or kV according to patient size and/or use of iterative reconstruction technique. COMPARISON:  05/28/2022 screening chest CT FINDINGS: Cardiovascular: Normal heart size. No significant pericardial effusion/thickening. Left anterior descending coronary atherosclerosis. Atherosclerotic nonaneurysmal thoracic aorta. Normal caliber pulmonary arteries. Mediastinum/Nodes: No significant thyroid nodules. Unremarkable esophagus. No pathologically enlarged axillary, mediastinal or hilar lymph nodes, noting limited sensitivity for the detection of hilar adenopathy on this noncontrast study. Lungs/Pleura: No pneumothorax. No pleural effusion. Mild centrilobular emphysema with diffuse bronchial wall thickening. No acute consolidative airspace disease or lung masses. Previously described indistinct 3.9 mm right lower lobe pulmonary nodule from 05/28/2022 chest CT is absent on today's scan. No new significant pulmonary nodules. Upper abdomen: Simple 4.3 cm central liver cyst with subcentimeter hypodense posterior right liver lesion that is too small to characterize and unchanged, considered benign. Musculoskeletal: No aggressive appearing focal osseous lesions. Moderate thoracic spondylosis. IMPRESSION: 1. Lung-RADS 1, negative. Continue annual screening with low-dose chest CT without contrast in 12 months. 2. One vessel coronary atherosclerosis. 3. Aortic Atherosclerosis (ICD10-I70.0) and Emphysema (ICD10-J43.9). Electronically Signed   By: Levell Reach M.D.   On:  06/15/2023 23:19    No results found.  No results found.    Assessment and Plan: Patient Active Problem List   Diagnosis Date Noted   CPAP use counseling 09/21/2023   Primary osteoarthritis of left knee 03/17/2022   Hoarding behavior 10/30/2020   Mild cognitive impairment 12/09/2019   Diabetes (HCC) 05/23/2019   Chronic pain  syndrome 01/18/2018   Chronic pain of right ankle 01/18/2018   Anxiety 04/11/2017   Insomnia 04/11/2017   OSA (obstructive sleep apnea) 12/20/2014   Fatigue 09/26/2014   Left knee pain 10/03/2013   Musculoskeletal back pain 09/08/2013   Depression 09/08/2013   HTN (hypertension) 09/08/2013   1. OSA (obstructive sleep apnea) (Primary) The patient does tolerate PAP and reports  benefit from PAP use. We had a discussion about the severity of his apnea because of his noncompliance. He will work on this.  The patient was reminded how to clean equipment and advised to replace supplies routinely. The patient was also counselled on weight loss. The compliance is poor. The AHI is 1.6.   OSA on cpap- controlled.  Increase  compliance with pap. CPAP continues to be medically necessary to treat this patient's OSA. F/u 10m  2. CPAP use counseling CPAP Counseling: had a lengthy discussion with the patient regarding the importance of PAP therapy in management of the sleep apnea. Patient appears to understand the risk factor reduction and also understands the risks associated with untreated sleep apnea. Patient will try to make a good faith effort to remain compliant with therapy. Also instructed the patient on proper cleaning of the device including the water must be changed daily if possible and use of distilled water is preferred. Patient understands that the machine should be regularly cleaned with appropriate recommended cleaning solutions that do not damage the PAP machine for example given white vinegar and water rinses. Other methods such as ozone treatment may not be  as good as these simple methods to achieve cleaning.   3. Hypertension, unspecified type Controlled with metoprolol and lisinopril-hydrochlorothiazide. Continue.     General Counseling: I have discussed the findings of the evaluation and examination with Jason [name].  I have also discussed any further diagnostic evaluation thatmay be needed or ordered today. Jason [name] verbalizes understanding of the findings of todays visit. We also reviewed his medications today and discussed drug interactions and side effects including but not limited excessive drowsiness and altered mental states. We also discussed that there is always a risk not just to him but also people around him. he has been encouraged to call the office with any questions or concerns that should arise related to todays visit.  No orders of the defined types were placed in this encounter.       I have personally obtained a history, examined the patient, evaluated laboratory and imaging results, formulated the assessment and plan and placed orders. This patient was seen today by Louann Rous, PA-C in collaboration with Dr. Cam Cava.   Cordie Deters, MD [facility] Diplomate ABMS Pulmonary Critical Care Medicine and Sleep Medicine

## 2023-09-21 ENCOUNTER — Ambulatory Visit (INDEPENDENT_AMBULATORY_CARE_PROVIDER_SITE_OTHER): Admitting: Internal Medicine

## 2023-09-21 VITALS — BP 131/82 | HR 93 | Resp 16 | Ht 68.0 in | Wt 219.0 lb

## 2023-09-21 DIAGNOSIS — G4733 Obstructive sleep apnea (adult) (pediatric): Secondary | ICD-10-CM

## 2023-09-21 DIAGNOSIS — I10 Essential (primary) hypertension: Secondary | ICD-10-CM | POA: Diagnosis not present

## 2023-09-21 DIAGNOSIS — Z7189 Other specified counseling: Secondary | ICD-10-CM

## 2023-09-21 NOTE — Patient Instructions (Signed)

## 2023-11-26 ENCOUNTER — Ambulatory Visit
Admission: EM | Admit: 2023-11-26 | Discharge: 2023-11-26 | Disposition: A | Discharge status: Home / Self Care | Attending: Emergency Medicine | Admitting: Emergency Medicine

## 2023-11-26 ENCOUNTER — Encounter: Payer: Self-pay | Admitting: Emergency Medicine

## 2023-11-26 DIAGNOSIS — W57XXXA Bitten or stung by nonvenomous insect and other nonvenomous arthropods, initial encounter: Secondary | ICD-10-CM

## 2023-11-26 DIAGNOSIS — S30860A Insect bite (nonvenomous) of lower back and pelvis, initial encounter: Secondary | ICD-10-CM

## 2023-11-26 DIAGNOSIS — L03312 Cellulitis of back [any part except buttock]: Secondary | ICD-10-CM

## 2023-11-26 DIAGNOSIS — Z23 Encounter for immunization: Secondary | ICD-10-CM

## 2023-11-26 MED ORDER — DOXYCYCLINE HYCLATE 100 MG PO CAPS: 100.0000 mg | ORAL_CAPSULE | Freq: Two times a day (BID) | ORAL | 0 refills | Status: AC

## 2023-11-26 MED ORDER — TETANUS-DIPHTH-ACELL PERTUSSIS 5-2.5-18.5 LF-MCG/0.5 IM SUSY
0.5000 mL | PREFILLED_SYRINGE | Freq: Once | INTRAMUSCULAR | Status: AC
  Administered 2023-11-26: 0.5 mL via INTRAMUSCULAR

## 2023-11-26 NOTE — Discharge Instructions (Addendum)
 Take the doxycycline  as directed.  Follow up with your primary care provider tomorrow.  Go to the emergency department if you have worsening symptoms.    Your tetanus was updated today.

## 2023-11-26 NOTE — ED Provider Notes (Signed)
 Jason [name]    CSN: [id number] Arrival date & time: 11/26/23  1655      History   Chief Complaint Chief Complaint  Patient presents with   Tick Removal    HPI Jason [name] is a 58 y.o. male.  Patient presents with a tick bite on his right lower back that he noticed yesterday.  He attempted to remove it but thinks he may have left the head in his skin.  The area around it has become red.  No fever, chills, purulent drainage.  Last tetanus unknown.  The history is provided by the patient and medical records.    Past Medical History:  Diagnosis Date   Anxiety    Arthritis    Chronic low back pain    Chronic pain 2002   Degenerative disc disease, lumbar    Depression    Diverticulosis of colon    History of hiatal hernia    Hypertension    Incomplete right bundle branch block    OSA on CPAP    Right nephrolithiasis    Urgency of urination    Wears partial dentures    UPPER AND LOWER    Patient Active Problem List   Diagnosis Date Noted   CPAP use counseling 09/21/2023   Primary osteoarthritis of left knee 03/17/2022   Hoarding behavior 10/30/2020   Mild cognitive impairment 12/09/2019   Diabetes (HCC) 05/23/2019   Chronic pain syndrome 01/18/2018   Chronic pain of right ankle 01/18/2018   Anxiety 04/11/2017   Insomnia 04/11/2017   OSA (obstructive sleep apnea) 12/20/2014   Fatigue 09/26/2014   Left knee pain 10/03/2013   Musculoskeletal back pain 09/08/2013   Depression 09/08/2013   HTN (hypertension) 09/08/2013    Past Surgical History:  Procedure Laterality Date   COLONOSCOPY W/ BIOPSIES AND POLYPECTOMY     CYSTOSCOPY WITH HOLMIUM LASER LITHOTRIPSY Right 02/14/2015   Procedure: CYSTOSCOPY WITH HOLMIUM LASER LITHOTRIPSY;  Surgeon: Osborn Blaze, MD;  [facility];  Service: Urology;  Laterality: Right;   CYSTOSCOPY WITH RETROGRADE PYELOGRAM, URETEROSCOPY AND STENT PLACEMENT Right 02/14/2015   Procedure: CYSTOSCOPY WITH  RETROGRADE PYELOGRAM, URETEROSCOPY AND STENT PLACEMENT;  Surgeon: Osborn Blaze, MD;  Location: [facility];  Service: Urology;  Laterality: Right;   KIDNEY STONE SURGERY     NO PAST SURGERIES     STONE EXTRACTION WITH BASKET Right 02/14/2015   Procedure: STONE EXTRACTION WITH BASKET;  Surgeon: Osborn Blaze, MD;  Location[facility];  Service: Urology;  Laterality: Right;       Home Medications    Prior to Admission medications   Medication Sig Start Date End Date Taking? Authorizing Provider  doxycycline  (VIBRAMYCIN ) 100 MG capsule Take 1 capsule (100 mg total) by mouth 2 (two) times daily for 7 days. 11/26/23 12/03/23 Yes Wellington Half, NP  albuterol (PROVENTIL HFA;VENTOLIN HFA) 108 (90 Base) MCG/ACT inhaler Inhale 2 puffs into the lungs every 4 (four) hours as needed for wheezing or shortness of breath (4-6 hours PRN).    [provider]  atorvastatin (LIPITOR) 40 MG tablet Take by mouth. 06/25/22   [provider]  JANUMET 50-1000 MG tablet Take 1 tablet by mouth 2 (two) times daily. 06/19/22   [provider]  lisinopril-hydrochlorothiazide (ZESTORETIC) 20-25 MG tablet Take 1 tablet by mouth daily. 06/19/22   [provider]  meloxicam (MOBIC) 15 MG tablet Take 15 mg by mouth daily. 07/30/22   [provider]  metoprolol succinate (TOPROL-XL) 25 MG 24 hr tablet Take 25 mg by mouth 2 (two) times daily.     [provider]  OZEMPIC, 0.25 OR 0.5 MG/DOSE, 2 MG/3ML SOPN Inject into the skin. 07/31/22   [provider]    Family History Family History  Problem Relation Age of Onset   Hypertension Mother    Cancer Mother    Hypertension Father    Dementia Father     Social History Social History   Tobacco Use   Smoking status: Former    Current packs/day: 0.00    Average packs/day: 1.3 packs/day for 38.0 years (47.5 ttl pk-yrs)    Types: Cigarettes    Start date: 02/07/1981    Quit date:  02/08/2019    Years since quitting: 4.8   Smokeless tobacco: Never  Vaping Use   Vaping status: Never Used  Substance Use Topics   Alcohol use: No    Alcohol/week: 2.0 standard drinks of alcohol    Types: 2 Shots of liquor per week    Comment: Quit drinking about a year    Drug use: Yes    Types: Marijuana    Comment: Rarely uses marijuana     Allergies   Patient has no known allergies.   Review of Systems Review of Systems  Constitutional:  Negative for chills and fever.  Skin:  Positive for color change and wound.     Physical Exam Triage Vital Signs ED Triage Vitals [11/26/23 1702]  Encounter Vitals Group     BP (!) 140/83     Girls Systolic BP Percentile      Girls Diastolic BP Percentile      Boys Systolic BP Percentile      Boys Diastolic BP Percentile      Pulse Rate 83     Resp 16     Temp 98.6 F (37 C)     Temp src      SpO2 98 %     Weight      Height      Head Circumference      Peak Flow      Pain Score      Pain Loc      Pain Education      Exclude from Growth Chart    No data found.  Updated Vital Signs BP (!) 140/83   Pulse 83   Temp 98.6 F (37 C)   Resp 16   SpO2 98%   Visual Acuity Right Eye Distance:   Left Eye Distance:   Bilateral Distance:    Right Eye Near:   Left Eye Near:    Bilateral Near:     Physical Exam Constitutional:      General: He is not in acute distress. HENT:     Mouth/Throat:     Mouth: Mucous membranes are moist.   Cardiovascular:     Rate and Rhythm: Normal rate and regular rhythm.  Pulmonary:     Effort: Pulmonary effort is normal. No respiratory distress.   Skin:    General: Skin is warm and dry.     Findings: Erythema and lesion present.     Comments: Right lower back: 5 cm x 3 cm area of erythema with central lesion; no purulent drainage.  1 mm dark brown foreign body in center of lesion which dislodged without difficulty when this NP was cleaning the wound.    Neurological:     Mental  Status: He is alert.  UC Treatments / Results  Labs (all labs ordered are listed, but only abnormal results are displayed) Labs Reviewed - No data to display  EKG   Radiology No results found.  Procedures Procedures (including critical care time)  Medications Ordered in UC Medications  Tdap (BOOSTRIX) injection 0.5 mL (0.5 mLs Intramuscular Incomplete 11/26/23 1723)    Initial Impression / Assessment and Plan / UC Course  I have reviewed the triage vital signs and the nursing notes.  Pertinent labs & imaging results that were available during my care of the patient were reviewed by me and considered in my medical decision making (see chart for details).    Mild cellulitis due to tick bite on right lower back.  Afebrile and vital signs are stable.  Tetanus updated today.  Treating with 7-day course of doxycycline .  Education provided on tick bites.  Instructed patient to follow-up with his PCP tomorrow.  ED precautions given.  He agrees to plan of care.  Final Clinical Impressions(s) / UC Diagnoses   Final diagnoses:  Cellulitis of back except buttock  Tick bite of back, initial encounter     Discharge Instructions      Take the doxycycline  as directed.  Follow up with your primary care provider tomorrow.  Go to the emergency department if you have worsening symptoms.    Your tetanus was updated today.       ED Prescriptions     Medication Sig Dispense Auth. Provider   doxycycline  (VIBRAMYCIN ) 100 MG capsule Take 1 capsule (100 mg total) by mouth 2 (two) times daily for 7 days. 14 capsule Wellington Half, NP      PDMP not reviewed this encounter.   Wellington Half, NP 11/26/23 1730

## 2023-11-26 NOTE — ED Triage Notes (Signed)
 Patient reports that he had a tick bite last night to right side and pulled off. Thinks the head is still in site. Denies pain at this time.

## 2023-11-27 NOTE — Progress Notes (Unsigned)
 [facility] [address] [place], [place] [address]  Pulmonary Sleep Medicine   Office Visit Note  Patient Name: Jason [name] DOB: [birth year] MRN [id number]    Chief Complaint: Obstructive Sleep Apnea visit  Brief History:  Miquan is seen today for a 2 month follow up visit for CPAP@ 8 cmH2O. The patient has a 2 year history of sleep apnea. Patient is using PAP nightly.  The patient feels rested after sleeping with PAP.  The patient reports benefiting from PAP use. Reported sleepiness is  improved and the Epworth Sleepiness Score is *** out of 24. The patient *** take naps. The patient complains of the following: ***  The compliance download shows 85%  compliance with an average use time of 5 hours 18 minutes. The AHI is 1.6.  The patient *** of limb movements disrupting sleep. The patient continues to require PAP therapy in order to eliminate sleep apnea.   ROS  General: (-) fever, (-) chills, (-) night sweat Nose and Sinuses: (-) nasal stuffiness or itchiness, (-) postnasal drip, (-) nosebleeds, (-) sinus trouble. Mouth and Throat: (-) sore throat, (-) hoarseness. Neck: (-) swollen glands, (-) enlarged thyroid, (-) neck pain. Respiratory: *** cough, *** shortness of breath, *** wheezing. Neurologic: *** numbness, *** tingling. Psychiatric: *** anxiety, *** depression   Current Medication: Outpatient Encounter Medications as of 11/30/2023  Medication Sig   albuterol (PROVENTIL HFA;VENTOLIN HFA) 108 (90 Base) MCG/ACT inhaler Inhale 2 puffs into the lungs every 4 (four) hours as needed for wheezing or shortness of breath (4-6 hours PRN).   atorvastatin (LIPITOR) 40 MG tablet Take by mouth.   doxycycline  (VIBRAMYCIN ) 100 MG capsule Take 1 capsule (100 mg total) by mouth 2 (two) times daily for 7 days.   JANUMET 50-1000 MG tablet Take 1 tablet by mouth 2 (two) times daily.   lisinopril-hydrochlorothiazide (ZESTORETIC) 20-25 MG tablet Take 1 tablet by mouth daily.    meloxicam (MOBIC) 15 MG tablet Take 15 mg by mouth daily.   metoprolol succinate (TOPROL-XL) 25 MG 24 hr tablet Take 25 mg by mouth 2 (two) times daily.    OZEMPIC, 0.25 OR 0.5 MG/DOSE, 2 MG/3ML SOPN Inject into the skin.   No facility-administered encounter medications on file as of 11/30/2023.    Surgical History: Past Surgical History:  Procedure Laterality Date   COLONOSCOPY W/ BIOPSIES AND POLYPECTOMY     CYSTOSCOPY WITH HOLMIUM LASER LITHOTRIPSY Right 02/14/2015   Procedure: CYSTOSCOPY WITH HOLMIUM LASER LITHOTRIPSY;  Surgeon: Osborn Blaze, MD;  Location: [facility];  Service: Urology;  Laterality: Right;   CYSTOSCOPY WITH RETROGRADE PYELOGRAM, URETEROSCOPY AND STENT PLACEMENT Right 02/14/2015   Procedure: CYSTOSCOPY WITH RETROGRADE PYELOGRAM, URETEROSCOPY AND STENT PLACEMENT;  Surgeon: Osborn Blaze, MD;  Location: [facility];  Service: Urology;  Laterality: Right;   KIDNEY STONE SURGERY     NO PAST SURGERIES     STONE EXTRACTION WITH BASKET Right 02/14/2015   Procedure: STONE EXTRACTION WITH BASKET;  Surgeon: Osborn Blaze, MD;  Location: [facility];  Service: Urology;  Laterality: Right;    Medical History: Past Medical History:  Diagnosis Date   Anxiety    Arthritis    Chronic low back pain    Chronic pain 2002   Degenerative disc disease, lumbar    Depression    Diverticulosis of colon    History of hiatal hernia    Hypertension    Incomplete right bundle branch block    OSA  on CPAP    Right nephrolithiasis    Urgency of urination    Wears partial dentures    UPPER AND LOWER    Family History: Non contributory to the present illness  Social History: Social History   Socioeconomic History   Marital status: Married    Spouse name: Not on file   Number of children: Not on file   Years of education: Not on file   Highest education level: Not on file  Occupational History   Not on file  Tobacco Use   Smoking  status: Former    Current packs/day: 0.00    Average packs/day: 1.3 packs/day for 38.0 years (47.5 ttl pk-yrs)    Types: Cigarettes    Start date: 02/07/1981    Quit date: 02/08/2019    Years since quitting: 4.8   Smokeless tobacco: Never  Vaping Use   Vaping status: Never Used  Substance and Sexual Activity   Alcohol use: No    Alcohol/week: 2.0 standard drinks of alcohol    Types: 2 Shots of liquor per week    Comment: Quit drinking about a year    Drug use: Yes    Types: Marijuana    Comment: Rarely uses marijuana   Sexual activity: Not on file  Other Topics Concern   Not on file  Social History Narrative   Not on file   Social Drivers of Health   Financial Resource Strain: High Risk (06/23/2019)   Received from [facility]   Overall Financial Resource Strain (CARDIA)    Difficulty of Paying Living Expenses: Very hard  Food Insecurity: Food Insecurity Present (06/23/2019)   Received from [facility]   Hunger Vital Sign    Within the past 12 months, you worried that your food would run out before you got the money to buy more.: Sometimes true    Within the past 12 months, the food you bought just didn't last and you didn't have money to get more.: Sometimes true  Transportation Needs: Not on file  Physical Activity: Not on file  Stress: Not on file  Social Connections: Not on file  Intimate Partner Violence: Not on file    Vital Signs: There were no vitals taken for this visit. There is no height or weight on file to calculate BMI.    Examination: General Appearance: The patient is well-developed, well-nourished, and in no distress. Neck Circumference: 46 cm Skin: Gross inspection of skin unremarkable. Head: normocephalic, no gross deformities. Eyes: no gross deformities noted. ENT: ears appear grossly normal Neurologic: Alert and oriented. No involuntary movements.  STOP BANG RISK ASSESSMENT S (snore) Have you been told that you snore?     NO   T  (tired) Are you often tired, fatigued, or sleepy during the day?   NO  O (obstruction) Do you stop breathing, choke, or gasp during sleep? NO   P (pressure) Do you have or are you being treated for high blood pressure? YES   B (BMI) Is your body index greater than 35 kg/m? NO   A (age) Are you 59 years old or older? YES   N (neck) Do you have a neck circumference greater than 16 inches?   NO   G (gender) Are you a male? YES   TOTAL STOP/BANG "YES" ANSWERS        A STOP-Bang score of 2 or less is considered low risk, and a score of 5 or more is high risk for having  either moderate or severe OSA. For people who score 3 or 4, doctors may need to perform further assessment to determine how likely they are to have OSA.         EPWORTH SLEEPINESS SCALE:  Scale:  (0)= no chance of dozing; (1)= slight chance of dozing; (2)= moderate chance of dozing; (3)= high chance of dozing  Chance  Situtation    Sitting and reading: ***    Watching TV: ***    Sitting Inactive in public: ***    As a passenger in car: ***      Lying down to rest: ***    Sitting and talking: ***    Sitting quielty after lunch: ***    In a car, stopped in traffic: ***   TOTAL SCORE:   *** out of 24    SLEEP STUDIES:  PSG - 10/10/21 - AHI 20.8/hr, Supine AHI 87.4/hr, min Sp02 86% PSG - 12/06/22 - AHI 17.5/hr, Supine AHI 131/hr, min Sp02 85% Split - 05/13/2023 - AHI 51.2./hr, REM 23/hr, Supine AHI 53.9/hr, min Sp02 87% - CPAP @ 8cmH20   CPAP COMPLIANCE DATA:  Date Range: 09/28/2023-11/26/2023  Average Daily Use: 5 hours 18 minutes  Median Use: 5 hours 46 minutes  Compliance for > 4 Hours: 85%  AHI: 0.7 respiratory events per hour  Days Used: 58/60 days  Mask Leak: 67.4  95th Percentile Pressure: 8         LABS: No results found for this or any previous visit (from the past 2160 hours).  Radiology: No results found.  No results found.  No results found.    Assessment and  Plan: Patient Active Problem List   Diagnosis Date Noted   CPAP use counseling 09/21/2023   Primary osteoarthritis of left knee 03/17/2022   Hoarding behavior 10/30/2020   Mild cognitive impairment 12/09/2019   Diabetes (HCC) 05/23/2019   Chronic pain syndrome 01/18/2018   Chronic pain of right ankle 01/18/2018   Anxiety 04/11/2017   Insomnia 04/11/2017   OSA (obstructive sleep apnea) 12/20/2014   Fatigue 09/26/2014   Left knee pain 10/03/2013   Musculoskeletal back pain 09/08/2013   Depression 09/08/2013   HTN (hypertension) 09/08/2013      The patient *** tolerate PAP and reports *** benefit from PAP use. The patient was reminded how to *** and advised to ***. The patient was also counselled on ***. The compliance is ***. The AHI is ***.   ***  General Counseling: I have discussed the findings of the evaluation and examination with Kinan.  I have also discussed any further diagnostic evaluation thatmay be needed or ordered today. Cordarrius verbalizes understanding of the findings of todays visit. We also reviewed his medications today and discussed drug interactions and side effects including but not limited excessive drowsiness and altered mental states. We also discussed that there is always a risk not just to him but also people around him. he has been encouraged to call the office with any questions or concerns that should arise related to todays visit.  No orders of the defined types were placed in this encounter.       I have personally obtained a history, examined the patient, evaluated laboratory and imaging results, formulated the assessment and plan and placed orders.  Cordie Deters, MD [facility] Diplomate ABMS Pulmonary Critical Care Medicine and Sleep Medicine

## 2023-11-30 ENCOUNTER — Ambulatory Visit (INDEPENDENT_AMBULATORY_CARE_PROVIDER_SITE_OTHER): Admitting: Internal Medicine

## 2023-11-30 VITALS — BP 134/81 | HR 81 | Resp 16 | Ht 68.0 in | Wt 215.0 lb

## 2023-11-30 DIAGNOSIS — I10 Essential (primary) hypertension: Secondary | ICD-10-CM

## 2023-11-30 DIAGNOSIS — Z7189 Other specified counseling: Secondary | ICD-10-CM

## 2023-11-30 DIAGNOSIS — G4733 Obstructive sleep apnea (adult) (pediatric): Secondary | ICD-10-CM

## 2023-11-30 NOTE — Patient Instructions (Signed)

## 2024-04-07 ENCOUNTER — Telehealth: Payer: Self-pay

## 2024-04-07 NOTE — Telephone Encounter (Signed)
 LVM to call office and schedule annual Lung CT.

## 2024-07-12 ENCOUNTER — Ambulatory Visit

## 2024-07-12 VITALS — BP 149/93 | HR 93 | Ht 68.0 in | Wt 223.6 lb

## 2024-07-12 DIAGNOSIS — M1712 Unilateral primary osteoarthritis, left knee: Secondary | ICD-10-CM

## 2024-07-12 DIAGNOSIS — M25562 Pain in left knee: Secondary | ICD-10-CM | POA: Diagnosis not present

## 2024-07-12 DIAGNOSIS — G8929 Other chronic pain: Secondary | ICD-10-CM

## 2024-07-12 MED ORDER — ACETAMINOPHEN 500 MG PO TABS: 1000.0000 mg | ORAL_TABLET | Freq: Three times a day (TID) | ORAL | 0 refills | Status: AC | PRN

## 2024-07-12 MED ORDER — NAPROXEN 500 MG PO TABS: 500.0000 mg | ORAL_TABLET | Freq: Two times a day (BID) | ORAL | 0 refills | Status: AC

## 2024-07-12 NOTE — Patient Instructions (Signed)
 Do NOT take more than 3,000mg  (3G) of acetaminophen  (Tylenol ) in a 24 hour period. Prescribed Naproxen  500mg . Can take twice a day. Take with food to help prevent stomach upset.  You can receive a knee intra-articular steroid injection every three months. Return to clinic if you want another one.  Return to clinic if you want to discuss/schedule knee replacement surgery.

## 2024-07-13 ENCOUNTER — Other Ambulatory Visit: Payer: Self-pay | Admitting: *Deleted

## 2024-07-13 DIAGNOSIS — Z87891 Personal history of nicotine dependence: Secondary | ICD-10-CM

## 2024-07-13 DIAGNOSIS — Z122 Encounter for screening for malignant neoplasm of respiratory organs: Secondary | ICD-10-CM

## 2024-07-25 ENCOUNTER — Ambulatory Visit
